# Patient Record
Sex: Female | Born: 1980 | Race: Black or African American | Hispanic: No | Marital: Single | Smoking: Never smoker
Health system: Southern US, Community
[De-identification: ages and names within clinical notes are randomized; demographics above are authoritative.]

## PROBLEM LIST (undated history)

## (undated) DIAGNOSIS — IMO0002 Reserved for concepts with insufficient information to code with codable children: Secondary | ICD-10-CM

## (undated) DIAGNOSIS — A64 Unspecified sexually transmitted disease: Secondary | ICD-10-CM

## (undated) DIAGNOSIS — N390 Urinary tract infection, site not specified: Secondary | ICD-10-CM

## (undated) HISTORY — DX: Unspecified sexually transmitted disease: A64

## (undated) HISTORY — PX: HAND SURGERY: SHX662

## (undated) HISTORY — DX: Reserved for concepts with insufficient information to code with codable children: IMO0002

## (undated) HISTORY — DX: Urinary tract infection, site not specified: N39.0

---

## 1999-03-30 ENCOUNTER — Emergency Department (HOSPITAL_COMMUNITY): Admission: EM | Admit: 1999-03-30 | Discharge: 1999-03-30 | Payer: Self-pay | Admitting: Emergency Medicine

## 2000-06-15 DIAGNOSIS — R87619 Unspecified abnormal cytological findings in specimens from cervix uteri: Secondary | ICD-10-CM

## 2000-06-15 DIAGNOSIS — IMO0002 Reserved for concepts with insufficient information to code with codable children: Secondary | ICD-10-CM

## 2000-06-15 HISTORY — DX: Unspecified abnormal cytological findings in specimens from cervix uteri: R87.619

## 2000-06-15 HISTORY — DX: Reserved for concepts with insufficient information to code with codable children: IMO0002

## 2000-06-15 HISTORY — PX: CRYOTHERAPY: SHX1416

## 2001-09-01 ENCOUNTER — Other Ambulatory Visit: Admission: RE | Admit: 2001-09-01 | Discharge: 2001-09-01 | Payer: Self-pay | Admitting: Gynecology

## 2002-02-21 ENCOUNTER — Other Ambulatory Visit: Admission: RE | Admit: 2002-02-21 | Discharge: 2002-02-21 | Payer: Self-pay | Admitting: Gynecology

## 2003-03-05 ENCOUNTER — Other Ambulatory Visit: Admission: RE | Admit: 2003-03-05 | Discharge: 2003-03-05 | Payer: Self-pay | Admitting: Gynecology

## 2003-09-11 ENCOUNTER — Other Ambulatory Visit: Admission: RE | Admit: 2003-09-11 | Discharge: 2003-09-11 | Payer: Self-pay | Admitting: Gynecology

## 2004-04-09 ENCOUNTER — Other Ambulatory Visit: Admission: RE | Admit: 2004-04-09 | Discharge: 2004-04-09 | Payer: Self-pay | Admitting: Gynecology

## 2005-04-21 ENCOUNTER — Other Ambulatory Visit: Admission: RE | Admit: 2005-04-21 | Discharge: 2005-04-21 | Payer: Self-pay | Admitting: Gynecology

## 2005-12-08 ENCOUNTER — Emergency Department (HOSPITAL_COMMUNITY): Admission: EM | Admit: 2005-12-08 | Discharge: 2005-12-09 | Payer: Self-pay | Admitting: Emergency Medicine

## 2006-05-05 ENCOUNTER — Other Ambulatory Visit: Admission: RE | Admit: 2006-05-05 | Discharge: 2006-05-05 | Payer: Self-pay | Admitting: Gynecology

## 2006-09-09 ENCOUNTER — Other Ambulatory Visit: Admission: RE | Admit: 2006-09-09 | Discharge: 2006-09-09 | Payer: Self-pay | Admitting: Gynecology

## 2007-04-20 ENCOUNTER — Other Ambulatory Visit: Admission: RE | Admit: 2007-04-20 | Discharge: 2007-04-20 | Payer: Self-pay | Admitting: Gynecology

## 2007-11-24 ENCOUNTER — Emergency Department (HOSPITAL_COMMUNITY): Admission: EM | Admit: 2007-11-24 | Discharge: 2007-11-24 | Payer: Self-pay | Admitting: Emergency Medicine

## 2008-12-28 ENCOUNTER — Encounter: Admission: RE | Admit: 2008-12-28 | Discharge: 2008-12-28 | Payer: Self-pay | Admitting: Obstetrics and Gynecology

## 2010-03-25 ENCOUNTER — Emergency Department (HOSPITAL_COMMUNITY): Admission: EM | Admit: 2010-03-25 | Discharge: 2010-03-25 | Payer: Self-pay | Admitting: Family Medicine

## 2011-08-24 DIAGNOSIS — L6681 Central centrifugal cicatricial alopecia: Secondary | ICD-10-CM | POA: Insufficient documentation

## 2011-08-24 DIAGNOSIS — L669 Cicatricial alopecia, unspecified: Secondary | ICD-10-CM | POA: Insufficient documentation

## 2013-05-16 ENCOUNTER — Ambulatory Visit: Payer: Self-pay | Admitting: Certified Nurse Midwife

## 2013-05-30 ENCOUNTER — Encounter: Payer: Self-pay | Admitting: Certified Nurse Midwife

## 2013-06-01 ENCOUNTER — Ambulatory Visit (INDEPENDENT_AMBULATORY_CARE_PROVIDER_SITE_OTHER): Payer: BC Managed Care – PPO | Admitting: Certified Nurse Midwife

## 2013-06-01 ENCOUNTER — Encounter: Payer: Self-pay | Admitting: Certified Nurse Midwife

## 2013-06-01 VITALS — BP 110/70 | HR 68 | Resp 16 | Ht 70.75 in | Wt 245.0 lb

## 2013-06-01 DIAGNOSIS — Z01419 Encounter for gynecological examination (general) (routine) without abnormal findings: Secondary | ICD-10-CM

## 2013-06-01 DIAGNOSIS — Z309 Encounter for contraceptive management, unspecified: Secondary | ICD-10-CM

## 2013-06-01 DIAGNOSIS — Z0000 Encounter for general adult medical examination without abnormal findings: Secondary | ICD-10-CM

## 2013-06-01 LAB — POCT URINALYSIS DIPSTICK
Blood, UA: NEGATIVE
Ketones, UA: NEGATIVE
Nitrite, UA: NEGATIVE
Protein, UA: NEGATIVE
Urobilinogen, UA: NEGATIVE
pH, UA: 5

## 2013-06-01 MED ORDER — DROSPIRENONE-ETHINYL ESTRADIOL 3-0.03 MG PO TABS: 1.0000 | ORAL_TABLET | Freq: Every day | ORAL | Status: DC

## 2013-06-01 NOTE — Progress Notes (Signed)
Reviewed personally.  M. Suzanne Neelam Tiggs, MD.  

## 2013-06-01 NOTE — Progress Notes (Signed)
32 y.o. G0P0000 Single African American Fe here for annual exam.  Periods normal, no problems. Contraception working well. No partner change, no STD screening needed. Seeing wellness MD for lifestyle change, had labs and exam. Patient on thyroid support, and feeling better. No health issues today.  Patient's last menstrual period was 06/01/2013.          Sexually active: no  The current method of family planning is OCP (estrogen/progesterone).    Exercising: no  exercise Smoker:  no  Health Maintenance: Pap:  05-11-12 neg MMG:  none Colonoscopy:  none BMD:   none TDaP:  2008 Labs: Poct urine neg Self breast exam: done occ   reports that she has never smoked. She does not have any smokeless tobacco history on file. She reports that she drinks about 0.5 ounces of alcohol per week. She reports that she does not use illicit drugs.  Past Medical History  Diagnosis Date  . Abnormal Pap smear 2002  . Chronic UTI     Past Surgical History  Procedure Laterality Date  . Cryotherapy  2002    abnormal pap smear dysplasia    Current Outpatient Prescriptions  Medication Sig Dispense Refill  . drospirenone-ethinyl estradiol (OCELLA) 3-0.03 MG tablet Take 1 tablet by mouth daily.      Marland Kitchen liothyronine (CYTOMEL) 5 MCG tablet daily.      Marland Kitchen UNABLE TO FIND Topical spray for alopecia       No current facility-administered medications for this visit.    Family History  Problem Relation Age of Onset  . Hypertension Mother   . Diabetes Paternal Grandmother   . Rheum arthritis Father     ROS:  Pertinent items are noted in HPI.  Otherwise, a comprehensive ROS was negative.  Exam:   BP 110/70  Pulse 68  Resp 16  Ht 5' 10.75" (1.797 m)  Wt 245 lb (111.131 kg)  BMI 34.41 kg/m2  LMP 06/01/2013 Height: 5' 10.75" (179.7 cm)  Ht Readings from Last 3 Encounters:  06/01/13 5' 10.75" (1.797 m)    General appearance: alert, cooperative and appears stated age Head: Normocephalic, without  obvious abnormality, atraumatic Neck: no adenopathy, supple, symmetrical, trachea midline and thyroid normal to inspection and palpation and non-palpable Lungs: clear to auscultation bilaterally Breasts: normal appearance, no masses or tenderness, No nipple retraction or dimpling, No nipple discharge or bleeding, No axillary or supraclavicular adenopathy Heart: regular rate and rhythm Abdomen: soft, non-tender; no masses,  no organomegaly Extremities: extremities normal, atraumatic, no cyanosis or edema Skin: Skin color, texture, turgor normal. No rashes or lesions Lymph nodes: Cervical, supraclavicular, and axillary nodes normal. No abnormal inguinal nodes palpated Neurologic: Grossly normal   Pelvic: External genitalia:  no lesions              Urethra:  normal appearing urethra with no masses, tenderness or lesions              Bartholin's and Skene's: normal                 Vagina: normal appearing vagina with normal color and discharge, no lesions              Cervix: nomal, non tender              Pap taken: yes Bimanual Exam:  Uterus:  normal size, contour, position, consistency, mobility, non-tender and anteverted              Adnexa: normal adnexa and  no mass, fullness, tenderness               Rectovaginal: Confirms               Anus:  normal sphincter tone, no lesions  A:  Well Woman with normal exam  Contraception desires OCP  History of abnormal pap smear with Cryo in 2002 with normal pap smears  P:   Reviewed health and wellness pertinent to exam  Rx Ocella see order  Pap smear as per guidelines yearly   pap smear taken today with HPVHR  counseled on breast self exam, STD prevention, HIV risk factors and prevention, use and side effects of OCP's, adequate intake of calcium and vitamin D, diet and exercise  return annually or prn  An After Visit Summary was printed and given to the patient.

## 2013-06-01 NOTE — Patient Instructions (Signed)
General topics  Next pap or exam is  due in 1 year Take a Women's multivitamin Take 1200 mg. of calcium daily - prefer dietary If any concerns in interim to call back  Breast Self-Awareness Practicing breast self-awareness may pick up problems early, prevent significant medical complications, and possibly save your life. By practicing breast self-awareness, you can become familiar with how your breasts look and feel and if your breasts are changing. This allows you to notice changes early. It can also offer you some reassurance that your breast health is good. One way to learn what is normal for your breasts and whether your breasts are changing is to do a breast self-exam. If you find a lump or something that was not present in the past, it is best to contact your caregiver right away. Other findings that should be evaluated by your caregiver include nipple discharge, especially if it is bloody; skin changes or reddening; areas where the skin seems to be pulled in (retracted); or new lumps and bumps. Breast pain is seldom associated with cancer (malignancy), but should also be evaluated by a caregiver. BREAST SELF-EXAM The best time to examine your breasts is 5 7 days after your menstrual period is over.  ExitCare Patient Information 2013 [employer].   Exercise to Stay Healthy Exercise helps you become and stay healthy. EXERCISE IDEAS AND TIPS Choose exercises that:  You enjoy.  Fit into your day. You do not need to exercise really hard to be healthy. You can do exercises at a slow or medium level and stay healthy. You can:  Stretch before and after working out.  Try yoga, Pilates, or tai chi.  Lift weights.  Walk fast, swim, jog, run, climb stairs, bicycle, dance, or rollerskate.  Take aerobic classes. Exercises that burn about 150 calories:  Running 1  miles in 15 minutes.  Playing volleyball for 45 to 60 minutes.  Washing and waxing a car for 45 to 60  minutes.  Playing touch football for 45 minutes.  Walking 1  miles in 35 minutes.  Pushing a stroller 1  miles in 30 minutes.  Playing basketball for 30 minutes.  Raking leaves for 30 minutes.  Bicycling 5 miles in 30 minutes.  Walking 2 miles in 30 minutes.  Dancing for 30 minutes.  Shoveling snow for 15 minutes.  Swimming laps for 20 minutes.  Walking up stairs for 15 minutes.  Bicycling 4 miles in 15 minutes.  Gardening for 30 to 45 minutes.  Jumping rope for 15 minutes.  Washing windows or floors for 45 to 60 minutes. Document Released: 07/04/2010 Document Revised: 08/24/2011 Document Reviewed: 07/04/2010 ExitCare Patient Information 2013 [employer].   Other topics ( that may be useful information):    Sexually Transmitted Disease Sexually transmitted disease (STD) refers to any infection that is passed from person to person during sexual activity. This may happen by way of saliva, semen, blood, vaginal mucus, or urine. Common STDs include:  Gonorrhea.  Chlamydia.  Syphilis.  HIV/AIDS.  Genital herpes.  Hepatitis B and C.  Trichomonas.  Human papillomavirus (HPV).  Pubic lice. CAUSES  An STD may be spread by bacteria, virus, or parasite. A person can get an STD by:  Sexual intercourse with an infected person.  Sharing sex toys with an infected person.  Sharing needles with an infected person.  Having intimate contact with the genitals, mouth, or rectal areas of an infected person. SYMPTOMS  Some people may not have any symptoms, but   they can still pass the infection to others. Different STDs have different symptoms. Symptoms include:  Painful or bloody urination.  Pain in the pelvis, abdomen, vagina, anus, throat, or eyes.  Skin rash, itching, irritation, growths, or sores (lesions). These usually occur in the genital or anal area.  Abnormal vaginal discharge.  Penile discharge in men.  Soft, flesh-colored skin growths in the  genital or anal area.  Fever.  Pain or bleeding during sexual intercourse.  Swollen glands in the groin area.  Yellow skin and eyes (jaundice). This is seen with hepatitis. DIAGNOSIS  To make a diagnosis, your caregiver may:  Take a medical history.  Perform a physical exam.  Take a specimen (culture) to be examined.  Examine a sample of discharge under a microscope.  Perform blood test TREATMENT   Chlamydia, gonorrhea, trichomonas, and syphilis can be cured with antibiotic medicine.  Genital herpes, hepatitis, and HIV can be treated, but not cured, with prescribed medicines. The medicines will lessen the symptoms.  Genital warts from HPV can be treated with medicine or by freezing, burning (electrocautery), or surgery. Warts may come back.  HPV is a virus and cannot be cured with medicine or surgery.However, abnormal areas may be followed very closely by your caregiver and may be removed from the cervix, vagina, or vulva through office procedures or surgery. If your diagnosis is confirmed, your recent sexual partners need treatment. This is true even if they are symptom-free or have a negative culture or evaluation. They should not have sex until their caregiver says it is okay. HOME CARE INSTRUCTIONS  All sexual partners should be informed, tested, and treated for all STDs.  Take your antibiotics as directed. Finish them even if you start to feel better.  Only take over-the-counter or prescription medicines for pain, discomfort, or fever as directed by your caregiver.  Rest.  Eat a balanced diet and drink enough fluids to keep your urine clear or pale yellow.  Do not have sex until treatment is completed and you have followed up with your caregiver. STDs should be checked after treatment.  Keep all follow-up appointments, Pap tests, and blood tests as directed by your caregiver.  Only use latex condoms and water-soluble lubricants during sexual activity. Do not use  petroleum jelly or oils.  Avoid alcohol and illegal drugs.  Get vaccinated for HPV and hepatitis. If you have not received these vaccines in the past, talk to your caregiver about whether one or both might be right for you.  Avoid risky sex practices that can break the skin. The only way to avoid getting an STD is to avoid all sexual activity.Latex condoms and dental dams (for oral sex) will help lessen the risk of getting an STD, but will not completely eliminate the risk. SEEK MEDICAL CARE IF:   You have a fever.  You have any new or worsening symptoms. Document Released: 08/22/2002 Document Revised: 08/24/2011 Document Reviewed: 08/29/2010 ExitCare Patient Information 2013 [employer].    Domestic Abuse You are being battered or abused if someone close to you hits, pushes, or physically hurts you in any way. You also are being abused if you are forced into activities. You are being sexually abused if you are forced to have sexual contact of any kind. You are being emotionally abused if you are made to feel worthless or if you are constantly threatened. It is important to remember that help is available. No one has the right to abuse you. PREVENTION OF FURTHER   ABUSE  Learn the warning signs of danger. This varies with situations but may include: the use of alcohol, threats, isolation from friends and family, or forced sexual contact. Leave if you feel that violence is going to occur.  If you are attacked or beaten, report it to the police so the abuse is documented. You do not have to press charges. The police can protect you while you or the attackers are leaving. Get the officer's name and badge number and a copy of the report.  Find someone you can trust and tell them what is happening to you: your caregiver, a nurse, clergy member, close friend or family member. Feeling ashamed is natural, but remember that you have done nothing wrong. No one deserves abuse. Document Released:  05/29/2000 Document Revised: 08/24/2011 Document Reviewed: 08/07/2010 ExitCare Patient Information 2013 [employer].    How Much is Too Much Alcohol? Drinking too much alcohol can cause injury, accidents, and health problems. These types of problems can include:   Car crashes.  Falls.  Family fighting (domestic violence).  Drowning.  Fights.  Injuries.  Burns.  Damage to certain organs.  Having a baby with birth defects. ONE DRINK CAN BE TOO MUCH WHEN YOU ARE:  Working.  Pregnant or breastfeeding.  Taking medicines. Ask your doctor.  Driving or planning to drive. If you or someone you know has a drinking problem, get help from a doctor.  Document Released: 03/28/2009 Document Revised: 08/24/2011 Document Reviewed: 03/28/2009 ExitCare Patient Information 2013 [employer].   Smoking Hazards Smoking cigarettes is extremely bad for your health. Tobacco smoke has over 200 known poisons in it. There are over 60 chemicals in tobacco smoke that cause cancer. Some of the chemicals found in cigarette smoke include:   Cyanide.  Benzene.  Formaldehyde.  Methanol (wood alcohol).  Acetylene (fuel used in welding torches).  Ammonia. Cigarette smoke also contains the poisonous gases nitrogen oxide and carbon monoxide.  Cigarette smokers have an increased risk of many serious medical problems and Smoking causes approximately:  90% of all lung cancer deaths in men.  80% of all lung cancer deaths in women.  90% of deaths from chronic obstructive lung disease. Compared with nonsmokers, smoking increases the risk of:  Coronary heart disease by 2 to 4 times.  Stroke by 2 to 4 times.  Men developing lung cancer by 23 times.  Women developing lung cancer by 13 times.  Dying from chronic obstructive lung diseases by 12 times.  . Smoking is the most preventable cause of death and disease in our society.  WHY IS SMOKING ADDICTIVE?  Nicotine is the chemical  agent in tobacco that is capable of causing addiction or dependence.  When you smoke and inhale, nicotine is absorbed rapidly into the bloodstream through your lungs. Nicotine absorbed through the lungs is capable of creating a powerful addiction. Both inhaled and non-inhaled nicotine may be addictive.  Addiction studies of cigarettes and spit tobacco show that addiction to nicotine occurs mainly during the teen years, when young people begin using tobacco products. WHAT ARE THE BENEFITS OF QUITTING?  There are many health benefits to quitting smoking.   Likelihood of developing cancer and heart disease decreases. Health improvements are seen almost immediately.  Blood pressure, pulse rate, and breathing patterns start returning to normal soon after quitting. QUITTING SMOKING   American Lung Association - 1-800-LUNGUSA  American Cancer Society - 1-800-ACS-2345 Document Released: 07/09/2004 Document Revised: 08/24/2011 Document Reviewed: 03/13/2009 ExitCare Patient Information 2013 Exit[employer]   LL[employer].   Stress Management Stress is a state of physical or mental tension that often results from changes in your life or normal routine. Some common causes of stress are:  Death of a loved one.  Injuries or severe illnesses.  Getting fired or changing jobs.  Moving into a new home. Other causes may be:  Sexual problems.  Business or financial losses.  Taking on a large debt.  Regular conflict with someone at home or at work.  Constant tiredness from lack of sleep. It is not just bad things that are stressful. It may be stressful to:  Win the lottery.  Get married.  Buy a new car. The amount of stress that can be easily tolerated varies from person to person. Changes generally cause stress, regardless of the types of change. Too much stress can affect your health. It may lead to physical or emotional problems. Too little stress (boredom) may also become stressful. SUGGESTIONS TO  REDUCE STRESS:  Talk things over with your family and friends. It often is helpful to share your concerns and worries. If you feel your problem is serious, you may want to get help from a professional counselor.  Consider your problems one at a time instead of lumping them all together. Trying to take care of everything at once may seem impossible. List all the things you need to do and then start with the most important one. Set a goal to accomplish 2 or 3 things each day. If you expect to do too many in a single day you will naturally fail, causing you to feel even more stressed.  Do not use alcohol or drugs to relieve stress. Although you may feel better for a short time, they do not remove the problems that caused the stress. They can also be habit forming.  Exercise regularly - at least 3 times per week. Physical exercise can help to relieve that "uptight" feeling and will relax you.  The shortest distance between despair and hope is often a good night's sleep.  Go to bed and get up on time allowing yourself time for appointments without being rushed.  Take a short "time-out" period from any stressful situation that occurs during the day. Close your eyes and take some deep breaths. Starting with the muscles in your face, tense them, hold it for a few seconds, then relax. Repeat this with the muscles in your neck, shoulders, hand, stomach, back and legs.  Take good care of yourself. Eat a balanced diet and get plenty of rest.  Schedule time for having fun. Take a break from your daily routine to relax. HOME CARE INSTRUCTIONS   Call if you feel overwhelmed by your problems and feel you can no longer manage them on your own.  Return immediately if you feel like hurting yourself or someone else. Document Released: 11/25/2000 Document Revised: 08/24/2011 Document Reviewed: 07/18/2007 ExitCare Patient Information 2013 [employer].   

## 2013-11-30 ENCOUNTER — Encounter (HOSPITAL_COMMUNITY): Payer: Self-pay | Admitting: Emergency Medicine

## 2013-11-30 ENCOUNTER — Emergency Department (HOSPITAL_COMMUNITY)
Admission: EM | Admit: 2013-11-30 | Discharge: 2013-12-01 | Disposition: A | Discharge status: Home / Self Care | Payer: BC Managed Care – PPO | Attending: Emergency Medicine | Admitting: Emergency Medicine

## 2013-11-30 ENCOUNTER — Emergency Department (HOSPITAL_COMMUNITY): Payer: BC Managed Care – PPO

## 2013-11-30 DIAGNOSIS — Z79899 Other long term (current) drug therapy: Secondary | ICD-10-CM | POA: Insufficient documentation

## 2013-11-30 DIAGNOSIS — S060X0A Concussion without loss of consciousness, initial encounter: Secondary | ICD-10-CM

## 2013-11-30 DIAGNOSIS — Z87440 Personal history of urinary (tract) infections: Secondary | ICD-10-CM | POA: Insufficient documentation

## 2013-11-30 DIAGNOSIS — S0083XA Contusion of other part of head, initial encounter: Principal | ICD-10-CM | POA: Insufficient documentation

## 2013-11-30 DIAGNOSIS — S0003XA Contusion of scalp, initial encounter: Secondary | ICD-10-CM

## 2013-11-30 DIAGNOSIS — Y92410 Unspecified street and highway as the place of occurrence of the external cause: Secondary | ICD-10-CM | POA: Insufficient documentation

## 2013-11-30 DIAGNOSIS — Y9389 Activity, other specified: Secondary | ICD-10-CM | POA: Insufficient documentation

## 2013-11-30 DIAGNOSIS — S1093XA Contusion of unspecified part of neck, initial encounter: Secondary | ICD-10-CM | POA: Insufficient documentation
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
## 2013-12-01 NOTE — Discharge Instructions (Signed)
Your CAT scan did not show any concerning injuries or bleeding around the brain. Your providers feel you may have a concussion from head injury. Please followup with a primary care provider.    Concussion A concussion is a brain injury. It is caused by:  A hit to the head.  A quick and sudden movement (jolt) of the head or neck. A concussion is usually not life-threatening. Even so, it can cause serious problems. If you had a concussion before, you may have concussion-like problems after a hit to your head. HOME CARE General Instructions  Follow your doctor's directions carefully.  Take medicines only as told by your doctor.  Only take medicines your doctor says are safe.  Do not drink alcohol until your doctor says it is okay. Alcohol and some drugs can slow down healing. They can also put you at risk for further injury.  If you are having trouble remembering things, write them down.  Try to do one thing at a time if you get distracted easily. For example, do not watch TV while making dinner.  Talk to your family members or close friends when making important decisions.  Follow up with your doctor as told.  Watch your symptoms. Tell others to do the same. Serious problems can sometimes happen after a concussion. Older adults are more likely to have these problems.  Tell your teachers, school nurse, school counselor, coach, Event organiserathletic trainer, or work Production designer, theatre/television/filmmanager about your concussion. Tell them about what you can or cannot do. They should watch to see if:  It gets even harder for you to pay attention or concentrate.  It gets even harder for you to remember things or learn new things.  You need more time than normal to finish things.  You become annoyed (irritable) more than before.  You are not able to deal with stress as well.  You have more problems than before.  Rest. Make sure you:  Get plenty of sleep at night.  Go to sleep early.  Go to bed at the same time every  day. Try to wake up at the same time.  Rest during the day.  Take naps when you feel tired.  Limit activities where you have to think a lot or concentrate. These include:  Doing homework.  Doing work related to a job.  Watching TV.  Using the computer. Returning To Your Regular Activities Return to your normal activities slowly, not all at once. You must give your body and brain enough time to heal.   Do not play sports or do other athletic activities until your doctor says it is okay.  Ask your doctor when you can drive, ride a bicycle, or work other vehicles or machines. Never do these things if you feel dizzy.  Ask your doctor about when you can return to work or school. Preventing Another Concussion It is very important to avoid another brain injury, especially before you have healed. In rare cases, another injury can lead to permanent brain damage, brain swelling, or death. The risk of this is greatest during the first 7-10 days after your injury. Avoid injuries by:   Wearing a seat belt when riding in a car.  Not drinking too much alcohol.  Avoiding activities that could lead to a second concussion (such as contact sports).  Wearing a helmet when doing activities like:  Biking.  Skiing.  Skateboarding.  Skating.  Making your home safer by:  Removing things from the floor or stairways that  could make you trip.  Using grab bars in bathrooms and handrails by stairs.  Placing non-slip mats on floors and in bathtubs.  Improve lighting in dark areas. GET HELP IF:  It gets even harder for you to pay attention or concentrate.  It gets even harder for you to remember things or learn new things.  You need more time than normal to finish things.  You become annoyed (irritable) more than before.  You are not able to deal with stress as well.  You have more problems than before.  You have problems keeping your balance.  You are not able to react quickly  when you should. Get help if you have any of these problems for more than 2 weeks:   Lasting (chronic) headaches.  Dizziness or trouble balancing.  Feeling sick to your stomach (nausea).  Seeing (vision) problems.  Being affected by noises or light more than normal.  Feeling sad, low, down in the dumps, blue, gloomy, or empty (depressed).  Mood changes (mood swings).  Feeling of fear or nervousness about what may happen (anxiety).  Feeling annoyed.  Memory problems.  Problems concentrating or paying attention.  Sleep problems.  Feeling tired all the time. GET HELP RIGHT AWAY IF:   You have bad headaches or your headaches get worse.  You have weakness (even if it is in one hand, leg, or part of the face).  You have loss of feeling (numbness).  You feel off balance.  You keep throwing up (vomiting).  You feel tired.  One black center of your eye (pupil) is larger than the other.  You twitch or shake violently (convulse).  Your speech is not clear (slurred).  You are more confused, easily angered (agitated), or annoyed than before.  You have more trouble resting than before.  You are unable to recognize people or places.  You have neck pain.  It is difficult to wake you up.  You have unusual behavior changes.  You pass out (lose consciousness). MAKE SURE YOU:   Understand these instructions.  Will watch your condition.  Will get help right away if you are not doing well or get worse. Document Released: 05/20/2009 Document Revised: 06/06/2013 Document Reviewed: 12/22/2012 [facility] Patient Information 2015 [place], [place]. This information is not intended to replace advice given to you by your health care provider. Make sure you discuss any questions you have with your health care provider.   Hematoma A hematoma is a collection of blood under the skin, in an organ, in a body space, in a joint space, or in other tissue. The blood can clot to form a  lump that you can see and feel. The lump is often firm and may sometimes become sore and tender. Most hematomas get better in a few days to weeks. However, some hematomas may be serious and require medical care. Hematomas can range in size from very small to very large. CAUSES  A hematoma can be caused by a blunt or penetrating injury. It can also be caused by spontaneous leakage from a blood vessel under the skin. Spontaneous leakage from a blood vessel is more likely to occur in older people, especially those taking blood thinners. Sometimes, a hematoma can develop after certain medical procedures. SIGNS AND SYMPTOMS   A firm lump on the body.  Possible pain and tenderness in the area.  Bruising.Blue, dark blue, purple-red, or yellowish skin may appear at the site of the hematoma if the hematoma is close to the  surface of the skin. For hematomas in deeper tissues or body spaces, the signs and symptoms may be subtle. For example, an intra-abdominal hematoma may cause abdominal pain, weakness, fainting, and shortness of breath. An intracranial hematoma may cause a headache or symptoms such as weakness, trouble speaking, or a change in consciousness. DIAGNOSIS  A hematoma can usually be diagnosed based on your medical history and a physical exam. Imaging tests may be needed if your health care provider suspects a hematoma in deeper tissues or body spaces, such as the abdomen, head, or chest. These tests may include ultrasonography or a CT scan.  TREATMENT  Hematomas usually go away on their own over time. Rarely does the blood need to be drained out of the body. Large hematomas or those that may affect vital organs will sometimes need surgical drainage or monitoring. HOME CARE INSTRUCTIONS   Apply ice to the injured area:   Put ice in a plastic bag.   Place a towel between your skin and the bag.   Leave the ice on for 20 minutes, 2-3 times a day for the first 1 to 2 days.   After the  first 2 days, switch to using warm compresses on the hematoma.   Elevate the injured area to help decrease pain and swelling. Wrapping the area with an elastic bandage may also be helpful. Compression helps to reduce swelling and promotes shrinking of the hematoma. Make sure the bandage is not wrapped too tight.   If your hematoma is on a lower extremity and is painful, crutches may be helpful for a couple days.   Only take over-the-counter or prescription medicines as directed by your health care provider. SEEK IMMEDIATE MEDICAL CARE IF:   You have increasing pain, or your pain is not controlled with medicine.   You have a fever.   You have worsening swelling or discoloration.   Your skin over the hematoma breaks or starts bleeding.   Your hematoma is in your chest or abdomen and you have weakness, shortness of breath, or a change in consciousness.  Your hematoma is on your scalp (caused by a fall or injury) and you have a worsening headache or a change in alertness or consciousness. MAKE SURE YOU:   Understand these instructions.  Will watch your condition.  Will get help right away if you are not doing well or get worse. Document Released: 01/14/2004 Document Revised: 02/01/2013 Document Reviewed: 11/09/2012 [facility] Patient Information 2015 [place]. This information is not intended to replace advice given to you by your health care provider. Make sure you discuss any questions you have with your health care provider.

## 2013-12-01 NOTE — ED Provider Notes (Signed)
Medical screening examination/treatment/procedure(s) were performed by non-physician practitioner and as supervising physician I was immediately available for consultation/collaboration.   EKG Interpretation None       Olivia Mackielga M Otter, MD 12/01/13 [phone]

## 2014-06-19 ENCOUNTER — Telehealth: Payer: Self-pay | Admitting: Certified Nurse Midwife

## 2014-06-19 NOTE — Telephone Encounter (Signed)
Left message regarding upcoming appointment has been canceled and needs to be rescheduled. °

## 2014-06-21 ENCOUNTER — Ambulatory Visit: Payer: BC Managed Care – PPO | Admitting: Certified Nurse Midwife

## 2014-06-22 ENCOUNTER — Ambulatory Visit (INDEPENDENT_AMBULATORY_CARE_PROVIDER_SITE_OTHER): Payer: BC Managed Care – PPO | Admitting: Nurse Practitioner

## 2014-06-22 ENCOUNTER — Encounter: Payer: Self-pay | Admitting: Nurse Practitioner

## 2014-06-22 VITALS — BP 116/74 | HR 96 | Ht 71.0 in | Wt 249.0 lb

## 2014-06-22 DIAGNOSIS — E038 Other specified hypothyroidism: Secondary | ICD-10-CM

## 2014-06-22 DIAGNOSIS — Z01419 Encounter for gynecological examination (general) (routine) without abnormal findings: Secondary | ICD-10-CM

## 2014-06-22 DIAGNOSIS — Z0000 Encounter for general adult medical examination without abnormal findings: Secondary | ICD-10-CM

## 2014-06-22 DIAGNOSIS — Z113 Encounter for screening for infections with a predominantly sexual mode of transmission: Secondary | ICD-10-CM

## 2014-06-22 LAB — POCT URINALYSIS DIPSTICK
Bilirubin, UA: NEGATIVE
GLUCOSE UA: NEGATIVE
KETONES UA: NEGATIVE
Leukocytes, UA: NEGATIVE
Nitrite, UA: NEGATIVE
PROTEIN UA: NEGATIVE
RBC UA: NEGATIVE
Urobilinogen, UA: NEGATIVE
pH, UA: 5

## 2014-06-22 LAB — HEMOGLOBIN, FINGERSTICK: Hemoglobin, fingerstick: 11.9 g/dL — ABNORMAL LOW (ref 12.0–16.0)

## 2014-06-22 LAB — TSH: TSH: 0.047 u[IU]/mL — AB (ref 0.350–4.500)

## 2014-06-22 MED ORDER — LIOTHYRONINE SODIUM 25 MCG PO TABS: 25.0000 ug | ORAL_TABLET | Freq: Two times a day (BID) | ORAL | Status: DC

## 2014-06-22 NOTE — Progress Notes (Signed)
Patient ID: Sandy Oconnor, female   DOB: Feb 19, 1981, 34 y.o.   MRN: [id number] 34 y.o. G0 Single African American Fe here for annual exam.  Stopped taking the OCP over the summer - regular but shorter X 2 days.  Went back on OCP in November and cycle 6-7 days.   Moderate to heavy.  Heavy X 4 days.  Cramps that occur intermittent that doubles her over.   not associated with bladder or bowel changes and does not seem to be associated with menses.  Will do a menstrual app and track.  Dr. Alessandra Bevels had been following her for thyroid.  Last TSH about 6 months ago.  She has decided not to return there secondary to expense.  Ended last relationship in November. She is a school principal   Patient's last menstrual period was 06/12/2014 (exact date).          Sexually active: no  The current method of family planning is OCP (estrogen/progesterone).  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 She has never used smokeless tobacco. She reports that she drinks about 1.2 oz of alcohol per week. She reports that she does not use illicit drugs.  Past Medical History  Diagnosis Date  . Abnormal Pap smear 2002  . Chronic UTI     Past Surgical History  Procedure Laterality Date  . Cryotherapy  2002    abnormal pap smear dysplasia    Current Outpatient Prescriptions  Medication Sig Dispense Refill  . drospirenone-ethinyl estradiol (OCELLA) 3-0.03 MG tablet Take 1 tablet by mouth daily. 1 Package 12  . liothyronine (CYTOMEL) 25 MCG tablet Take 1 tablet (25 mcg total) by mouth 2 (two) times daily. 180 tablet 3  . UNABLE TO FIND Topical spray for alopecia     No current facility-administered medications for this visit.    Family History  Problem Relation Age of Onset  .  Hypertension Mother   . Diabetes Paternal Grandmother   . Rheum arthritis Father   . Breast cancer Other 70    ROS:  Pertinent items are noted in HPI.  Otherwise, a comprehensive ROS was negative.  Exam:   BP 116/74 mmHg  Pulse 96  Ht  (1.803 m)  Wt 249 lb (112.946 kg)  BMI 34.74 kg/m2  LMP 06/12/2014 (Exact Date) Height:  (180.3 cm)  Ht Readings from Last 3 Encounters:  06/22/14  (1.803 m)  06/01/13 5' 10.75" (1.797 m)    General appearance: alert, cooperative and appears stated age Head: Normocephalic, without obvious abnormality, atraumatic Neck: no adenopathy, supple, symmetrical, trachea midline and thyroid normal to inspection and palpation Lungs: clear to auscultation bilaterally Breasts: normal appearance, no masses or tenderness Heart: regular rate and rhythm Abdomen: soft, non-tender; no masses,  no organomegaly Extremities: extremities normal, atraumatic, no cyanosis or edema Skin: Skin color, texture, turgor normal. No rashes or lesions Lymph nodes: Cervical, supraclavicular, and axillary nodes normal. No abnormal inguinal nodes palpated Neurologic: Grossly normal   Pelvic: External genitalia:  no lesions              Urethra:  normal appearing urethra with no masses, tenderness or lesions  Bartholin's and Skene's: normal                 Vagina: normal appearing vagina with normal color and discharge, no lesions              Cervix: anteverted              Pap taken: Yes.   Bimanual Exam:  Uterus:  normal size, contour, position, consistency, mobility, non-tender              Adnexa: no mass, fullness, tenderness               Rectovaginal: Confirms               Anus:  normal sphincter tone, no lesions Chaperone present  A:  Well Woman with normal exam  Contraception with OCP History of abnormal pap smear with Cryo in 2002 with normal pap smears since  Hypothyroid  R/O STD's   P:   Reviewed health and wellness  pertinent to exam  Pap smear taken today  Refill on Ocella for a year  Refill on Cytomel for a year  Follow with TSH and other STD's  Counseled on breast self exam, STD prevention, use and side effects of OCP's, adequate intake of calcium and vitamin D, diet and exercise return annually or prn  An After Visit Summary was printed and given to the patient.

## 2014-06-22 NOTE — Patient Instructions (Signed)

## 2014-06-23 LAB — STD PANEL
HIV: NONREACTIVE
Hepatitis B Surface Ag: NEGATIVE

## 2014-06-26 LAB — IPS N GONORRHOEA AND CHLAMYDIA BY PCR

## 2014-06-26 NOTE — Progress Notes (Signed)
Encounter reviewed by Dr. Zayaan Kozak Silva.  

## 2014-06-27 LAB — IPS PAP TEST WITH HPV

## 2014-07-03 ENCOUNTER — Other Ambulatory Visit: Payer: Self-pay | Admitting: Certified Nurse Midwife

## 2014-07-04 ENCOUNTER — Telehealth: Payer: Self-pay | Admitting: *Deleted

## 2014-07-04 NOTE — Telephone Encounter (Signed)
Pt notified in result note.  Closing encounter. 

## 2014-07-04 NOTE — Telephone Encounter (Signed)
-----   Message from Lauro FranklinPatricia Rolen-Grubb, FNP sent at 06/26/2014  8:56 PM EST ----- Let patient know that GC and CHl is negative

## 2014-07-04 NOTE — Telephone Encounter (Signed)
I have attempted to contact this patient by phone with the following results: left message to return call to [place] GillespieStephanie at [phone] on answering machine (mobile [facility]).  Name verified in voicemail, advised call was regarding recent labs.  [phone] (Mobile)

## 2014-07-04 NOTE — Telephone Encounter (Signed)
Medication refill request: Ocella Last AEX: 06/22/14 Next AEX: 06/25/15 Last MMG (if hormonal medication request): none Refill authorized: 06/01/13 #1pack/12R. Today #1pack/12R.   Mrs Patty's patient. Mrs Patty's out of the office today Mrs Eunice BlaseDebbie.

## 2014-07-27 ENCOUNTER — Encounter (HOSPITAL_COMMUNITY): Payer: Self-pay | Admitting: Emergency Medicine

## 2014-07-27 ENCOUNTER — Emergency Department (HOSPITAL_COMMUNITY)
Admission: EM | Admit: 2014-07-27 | Discharge: 2014-07-27 | Disposition: A | Discharge status: Home / Self Care | Payer: BC Managed Care – PPO | Attending: Emergency Medicine | Admitting: Emergency Medicine

## 2014-07-27 DIAGNOSIS — Y9389 Activity, other specified: Secondary | ICD-10-CM | POA: Diagnosis not present

## 2014-07-27 DIAGNOSIS — Z79899 Other long term (current) drug therapy: Secondary | ICD-10-CM | POA: Diagnosis not present

## 2014-07-27 DIAGNOSIS — Y92410 Unspecified street and highway as the place of occurrence of the external cause: Secondary | ICD-10-CM | POA: Insufficient documentation

## 2014-07-27 DIAGNOSIS — S239XXA Sprain of unspecified parts of thorax, initial encounter: Secondary | ICD-10-CM

## 2014-07-27 DIAGNOSIS — Y998 Other external cause status: Secondary | ICD-10-CM | POA: Diagnosis not present

## 2014-07-27 DIAGNOSIS — S233XXA Sprain of ligaments of thoracic spine, initial encounter: Secondary | ICD-10-CM | POA: Diagnosis not present

## 2014-07-27 DIAGNOSIS — S161XXA Strain of muscle, fascia and tendon at neck level, initial encounter: Secondary | ICD-10-CM | POA: Diagnosis not present

## 2014-07-27 DIAGNOSIS — S199XXA Unspecified injury of neck, initial encounter: Secondary | ICD-10-CM | POA: Diagnosis present

## 2014-07-27 DIAGNOSIS — Z87440 Personal history of urinary (tract) infections: Secondary | ICD-10-CM | POA: Diagnosis not present

## 2014-07-27 MED ORDER — NAPROXEN 375 MG PO TABS
375.0000 mg | ORAL_TABLET | Freq: Once | ORAL | Status: AC
  Administered 2014-07-27: 375 mg via ORAL
  Filled 2014-07-27: qty 1

## 2014-07-27 MED ORDER — CYCLOBENZAPRINE HCL 10 MG PO TABS
5.0000 mg | ORAL_TABLET | Freq: Once | ORAL | Status: AC
  Administered 2014-07-27: 5 mg via ORAL
  Filled 2014-07-27: qty 1

## 2014-07-27 MED ORDER — NAPROXEN 375 MG PO TABS: 375.0000 mg | ORAL_TABLET | Freq: Three times a day (TID) | ORAL | Status: DC

## 2014-07-27 MED ORDER — CYCLOBENZAPRINE HCL 5 MG PO TABS: 5.0000 mg | ORAL_TABLET | Freq: Three times a day (TID) | ORAL | Status: DC | PRN
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
## 2014-07-27 NOTE — Discharge Instructions (Signed)
Cervical Sprain °A cervical sprain is an injury in the neck in which the strong, fibrous tissues (ligaments) that connect your neck bones stretch or tear. Cervical sprains can range from mild to severe. Severe cervical sprains can cause the neck vertebrae to be unstable. This can lead to damage of the spinal cord and can result in serious nervous system problems. The amount of time it takes for a cervical sprain to get better depends on the cause and extent of the injury. Most cervical sprains heal in 1 to 3 weeks. °CAUSES  °Severe cervical sprains may be caused by:  °· Contact sport injuries (such as from football, rugby, wrestling, hockey, auto racing, gymnastics, diving, martial arts, or boxing).   °· Motor vehicle collisions.   °· Whiplash injuries. This is an injury from a sudden forward and backward whipping movement of the head and neck.  °· Falls.   °Mild cervical sprains may be caused by:  °· Being in an awkward position, such as while cradling a telephone between your ear and shoulder.   °· Sitting in a chair that does not offer proper support.   °· Working at a poorly designed computer station.   °· Looking up or down for long periods of time.   °SYMPTOMS  °· Pain, soreness, stiffness, or a burning sensation in the front, back, or sides of the neck. This discomfort may develop immediately after the injury or slowly, 24 hours or more after the injury.   °· Pain or tenderness directly in the middle of the back of the neck.   °· Shoulder or upper back pain.   °· Limited ability to move the neck.   °· Headache.   °· Dizziness.   °· Weakness, numbness, or tingling in the hands or arms.   °· Muscle spasms.   °· Difficulty swallowing or chewing.   °· Tenderness and swelling of the neck.   °DIAGNOSIS  °Most of the time your health care provider can diagnose a cervical sprain by taking your history and doing a physical exam. Your health care provider will ask about previous neck injuries and any known neck  problems, such as arthritis in the neck. X-rays may be taken to find out if there are any other problems, such as with the bones of the neck. Other tests, such as a CT scan or MRI, may also be needed.  °TREATMENT  °Treatment depends on the severity of the cervical sprain. Mild sprains can be treated with rest, keeping the neck in place (immobilization), and pain medicines. Severe cervical sprains are immediately immobilized. Further treatment is done to help with pain, muscle spasms, and other symptoms and may include: °· Medicines, such as pain relievers, numbing medicines, or muscle relaxants.   °· Physical therapy. This may involve stretching exercises, strengthening exercises, and posture training. Exercises and improved posture can help stabilize the neck, strengthen muscles, and help stop symptoms from returning.   °HOME CARE INSTRUCTIONS  °· Put ice on the injured area.   °¨ Put ice in a plastic bag.   °¨ Place a towel between your skin and the bag.   °¨ Leave the ice on for 15-20 minutes, 3-4 times a day.   °· If your injury was severe, you may have been given a cervical collar to wear. A cervical collar is a two-piece collar designed to keep your neck from moving while it heals. °¨ Do not remove the collar unless instructed by your health care provider. °¨ If you have long hair, keep it outside of the collar. °¨ Ask your health care provider before making any adjustments to your collar. Minor   adjustments may be required over time to improve comfort and reduce pressure on your chin or on the back of your head.  Ifyou are allowed to remove the collar for cleaning or bathing, follow your health care provider's instructions on how to do so safely.  Keep your collar clean by wiping it with mild soap and water and drying it completely. If the collar you have been given includes removable pads, remove them every 1-2 days and hand wash them with soap and water. Allow them to air dry. They should be completely  dry before you wear them in the collar.  If you are allowed to remove the collar for cleaning and bathing, wash and dry the skin of your neck. Check your skin for irritation or sores. If you see any, tell your health care provider.  Do not drive while wearing the collar.   Only take over-the-counter or prescription medicines for pain, discomfort, or fever as directed by your health care provider.   Keep all follow-up appointments as directed by your health care provider.   Keep all physical therapy appointments as directed by your health care provider.   Make any needed adjustments to your workstation to promote good posture.   Avoid positions and activities that make your symptoms worse.   Warm up and stretch before being active to help prevent problems.  SEEK MEDICAL CARE IF:   Your pain is not controlled with medicine.   You are unable to decrease your pain medicine over time as planned.   Your activity level is not improving as expected.  SEEK IMMEDIATE MEDICAL CARE IF:   You develop any bleeding.  You develop stomach upset.  You have signs of an allergic reaction to your medicine.   Your symptoms get worse.   You develop new, unexplained symptoms.   You have numbness, tingling, weakness, or paralysis in any part of your body.  MAKE SURE YOU:   Understand these instructions.  Will watch your condition.  Will get help right away if you are not doing well or get worse. Document Released: 03/29/2007 Document Revised: 06/06/2013 Document Reviewed: 12/07/2012 [facility] Patient Information 2015 [place], [place]. This information is not intended to replace advice given to you by your health care provider. Make sure you discuss any questions you have with your health care provider. Use ice therapy for the first 24 hours followed by heat therapy
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
## 2014-08-03 ENCOUNTER — Encounter (HOSPITAL_COMMUNITY): Payer: Self-pay | Admitting: Emergency Medicine

## 2014-08-03 ENCOUNTER — Emergency Department (HOSPITAL_COMMUNITY): Payer: BC Managed Care – PPO

## 2014-08-03 ENCOUNTER — Emergency Department (HOSPITAL_COMMUNITY)
Admission: EM | Admit: 2014-08-03 | Discharge: 2014-08-03 | Disposition: A | Discharge status: Home / Self Care | Payer: BC Managed Care – PPO | Attending: Emergency Medicine | Admitting: Emergency Medicine

## 2014-08-03 DIAGNOSIS — T148XXA Other injury of unspecified body region, initial encounter: Secondary | ICD-10-CM

## 2014-08-03 DIAGNOSIS — Y92410 Unspecified street and highway as the place of occurrence of the external cause: Secondary | ICD-10-CM | POA: Insufficient documentation

## 2014-08-03 DIAGNOSIS — S299XXA Unspecified injury of thorax, initial encounter: Secondary | ICD-10-CM | POA: Insufficient documentation

## 2014-08-03 DIAGNOSIS — R112 Nausea with vomiting, unspecified: Secondary | ICD-10-CM | POA: Insufficient documentation

## 2014-08-03 DIAGNOSIS — S3991XA Unspecified injury of abdomen, initial encounter: Secondary | ICD-10-CM | POA: Diagnosis not present

## 2014-08-03 DIAGNOSIS — Z79899 Other long term (current) drug therapy: Secondary | ICD-10-CM | POA: Insufficient documentation

## 2014-08-03 DIAGNOSIS — T1490 Injury, unspecified: Secondary | ICD-10-CM | POA: Insufficient documentation

## 2014-08-03 DIAGNOSIS — Y998 Other external cause status: Secondary | ICD-10-CM | POA: Diagnosis not present

## 2014-08-03 DIAGNOSIS — Y9389 Activity, other specified: Secondary | ICD-10-CM | POA: Diagnosis not present

## 2014-08-03 DIAGNOSIS — S3992XA Unspecified injury of lower back, initial encounter: Secondary | ICD-10-CM | POA: Insufficient documentation

## 2014-08-03 DIAGNOSIS — R0781 Pleurodynia: Secondary | ICD-10-CM

## 2014-08-03 DIAGNOSIS — Z87440 Personal history of urinary (tract) infections: Secondary | ICD-10-CM | POA: Diagnosis not present

## 2014-08-03 LAB — CBC WITH DIFFERENTIAL/PLATELET
BASOS ABS: 0 10*3/uL (ref 0.0–0.1)
Basophils Relative: 0 % (ref 0–1)
Eosinophils Absolute: 0.4 10*3/uL (ref 0.0–0.7)
Eosinophils Relative: 4 % (ref 0–5)
HCT: 39.2 % (ref 36.0–46.0)
Hemoglobin: 12.7 g/dL (ref 12.0–15.0)
LYMPHS ABS: 2.8 10*3/uL (ref 0.7–4.0)
LYMPHS PCT: 29 % (ref 12–46)
MCH: 27.5 pg (ref 26.0–34.0)
MCHC: 32.4 g/dL (ref 30.0–36.0)
MCV: 84.8 fL (ref 78.0–100.0)
Monocytes Absolute: 0.8 10*3/uL (ref 0.1–1.0)
Monocytes Relative: 8 % (ref 3–12)
NEUTROS PCT: 59 % (ref 43–77)
Neutro Abs: 5.8 10*3/uL (ref 1.7–7.7)
PLATELETS: 300 10*3/uL (ref 150–400)
RBC: 4.62 MIL/uL (ref 3.87–5.11)
RDW: 14.4 % (ref 11.5–15.5)
WBC: 9.8 10*3/uL (ref 4.0–10.5)

## 2014-08-03 LAB — COMPREHENSIVE METABOLIC PANEL
ALBUMIN: 4.2 g/dL (ref 3.5–5.2)
ALT: 12 U/L (ref 0–35)
AST: 14 U/L (ref 0–37)
Alkaline Phosphatase: 54 U/L (ref 39–117)
Anion gap: 9 (ref 5–15)
BUN: 14 mg/dL (ref 6–23)
CALCIUM: 9.3 mg/dL (ref 8.4–10.5)
CO2: 25 mmol/L (ref 19–32)
Chloride: 105 mmol/L (ref 96–112)
Creatinine, Ser: 0.81 mg/dL (ref 0.50–1.10)
GLUCOSE: 94 mg/dL (ref 70–99)
POTASSIUM: 4.2 mmol/L (ref 3.5–5.1)
SODIUM: 139 mmol/L (ref 135–145)
TOTAL PROTEIN: 7.8 g/dL (ref 6.0–8.3)
Total Bilirubin: 0.4 mg/dL (ref 0.3–1.2)

## 2014-08-03 LAB — URINALYSIS, ROUTINE W REFLEX MICROSCOPIC
Bilirubin Urine: NEGATIVE
GLUCOSE, UA: NEGATIVE mg/dL
HGB URINE DIPSTICK: NEGATIVE
Ketones, ur: NEGATIVE mg/dL
LEUKOCYTES UA: NEGATIVE
Nitrite: NEGATIVE
Protein, ur: NEGATIVE mg/dL
SPECIFIC GRAVITY, URINE: 1.024 (ref 1.005–1.030)
UROBILINOGEN UA: 0.2 mg/dL (ref 0.0–1.0)
pH: 5.5 (ref 5.0–8.0)

## 2014-08-03 NOTE — Discharge Instructions (Signed)
Muscle Strain °A muscle strain is an injury that occurs when a muscle is stretched beyond its normal length. Usually a small number of muscle fibers are torn when this happens. Muscle strain is rated in degrees. First-degree strains have the least amount of muscle fiber tearing and pain. Second-degree and third-degree strains have increasingly more tearing and pain.  °Usually, recovery from muscle strain takes 1-2 weeks. Complete healing takes 5-6 weeks.  °CAUSES  °Muscle strain happens when a sudden, violent force placed on a muscle stretches it too far. This may occur with lifting, sports, or a fall.  °RISK FACTORS °Muscle strain is especially common in athletes.  °SIGNS AND SYMPTOMS °At the site of the muscle strain, there may be: °· Pain. °· Bruising. °· Swelling. °· Difficulty using the muscle due to pain or lack of normal function. °DIAGNOSIS  °Your health care provider will perform a physical exam and ask about your medical history. °TREATMENT  °Often, the best treatment for a muscle strain is resting, icing, and applying cold compresses to the injured area.   °HOME CARE INSTRUCTIONS  °· Use the PRICE method of treatment to promote muscle healing during the first 2-3 days after your injury. The PRICE method involves: °· Protecting the muscle from being injured again. °· Restricting your activity and resting the injured body part. °· Icing your injury. To do this, put ice in a plastic bag. Place a towel between your skin and the bag. Then, apply the ice and leave it on from 15-20 minutes each hour. After the third day, switch to moist heat packs. °· Apply compression to the injured area with a splint or elastic bandage. Be careful not to wrap it too tightly. This may interfere with blood circulation or increase swelling. °· Elevate the injured body part above the level of your heart as often as you can. °· Only take over-the-counter or prescription medicines for pain, discomfort, or fever as directed by your  health care provider. °· Warming up prior to exercise helps to prevent future muscle strains. °SEEK MEDICAL CARE IF:  °· You have increasing pain or swelling in the injured area. °· You have numbness, tingling, or a significant loss of strength in the injured area. °MAKE SURE YOU:  °· Understand these instructions. °· Will watch your condition. °· Will get help right away if you are not doing well or get worse. °Document Released: 06/01/2005 Document Revised: 03/22/2013 Document Reviewed: 12/29/2012 °[employer]® Patient Information ©2015 [employer]. This information is not intended to replace advice given to you by your health care provider. Make sure you discuss any questions you have with your health care provider. ° °Motor Vehicle Collision °It is common to have multiple bruises and sore muscles after a motor vehicle collision (MVC). These tend to feel worse for the first 24 hours. You may have the most stiffness and soreness over the first several hours. You may also feel worse when you wake up the first morning after your collision. After this point, you will usually begin to improve with each day. The speed of improvement often depends on the severity of the collision, the number of injuries, and the location and nature of these injuries. °HOME CARE INSTRUCTIONS °· Put ice on the injured area. °¨ Put ice in a plastic bag. °¨ Place a towel between your skin and the bag. °¨ Leave the ice on for 15-20 minutes, 3-4 times a day, or as directed by your health care provider. °· Drink enough fluids to   keep your urine clear or pale yellow. Do not drink alcohol. °· Take a warm shower or bath once or twice a day. This will increase blood flow to sore muscles. °· You may return to activities as directed by your caregiver. Be careful when lifting, as this may aggravate neck or back pain. °· Only take over-the-counter or prescription medicines for pain, discomfort, or fever as directed by your caregiver. Do not use  aspirin. This may increase bruising and bleeding. °SEEK IMMEDIATE MEDICAL CARE IF: °· You have numbness, tingling, or weakness in the arms or legs. °· You develop severe headaches not relieved with medicine. °· You have severe neck pain, especially tenderness in the middle of the back of your neck. °· You have changes in bowel or bladder control. °· There is increasing pain in any area of the body. °· You have shortness of breath, light-headedness, dizziness, or fainting. °· You have chest pain. °· You feel sick to your stomach (nauseous), throw up (vomit), or sweat. °· You have increasing abdominal discomfort. °· There is blood in your urine, stool, or vomit. °· You have pain in your shoulder (shoulder strap areas). °· You feel your symptoms are getting worse. °MAKE SURE YOU: °· Understand these instructions. °· Will watch your condition. °· Will get help right away if you are not doing well or get worse. °Document Released: 06/01/2005 Document Revised: 10/16/2013 Document Reviewed: 10/29/2010 °ExitCare® Patient Information ©2015 [employer]. This information is not intended to replace advice given to you by your health care provider. Make sure you discuss any questions you have with your health care provider. ° °
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
## 2014-08-03 NOTE — ED Notes (Signed)
Patient transported to X-ray 

## 2014-11-14 ENCOUNTER — Telehealth: Payer: Self-pay | Admitting: Emergency Medicine

## 2014-11-14 NOTE — Telephone Encounter (Signed)
Many thanks for the follow up and we will see her as scheduled.

## 2014-11-14 NOTE — Telephone Encounter (Signed)
Calling patient per request from Lauro FranklinPatricia Rolen-Grubb, FNP. Received a letter from [facility] that patient has been seen on 09/26/14 at outside provider with dx of Angina Pectoris.   [facility]/o of abdomen and chest pain. She was subsequently diagnosed with Pleurisy and discharged from Emergency Department. Patient states she had an ultrasound of chest and abdomen which were normal. Patient states she was having pain with deep inspiration.  Patient states she continues to have intermittent LLQ pain that radiates to upper L Breast area. She states that she has "episodes" and they last 10-15 minutes and they subside. She states that nothing brings on the pain that she is aware of and that she has not tried anything to make the pain go away. She states she has noticed some aversion to foods that she has not had prior. Denies any other symptoms, no dysuria, diarrhea, constipation, abnormal vaginal bleeding.  At this time, patient denies complaints. She does not have a PCP. She is offered an appointment for evaluation, however, she declines any appointment until after 11/23/14 when school is out.   Patient is scheduled for office visit with NP Grubb for 11/27/14 at 1115 and is advised to return call or seek care at nearest Emergency department if develops sob, chest pain, or any worsening of her symptoms. Patient verbalized understanding of this and agreeable to instructions.   Routing to provider to review and will close encounter.   Call to [facility] at [phone] to request medical records from visit and requested faxed to our office.

## 2014-11-27 ENCOUNTER — Ambulatory Visit (INDEPENDENT_AMBULATORY_CARE_PROVIDER_SITE_OTHER): Payer: BC Managed Care – PPO | Admitting: Nurse Practitioner

## 2014-11-27 ENCOUNTER — Encounter: Payer: Self-pay | Admitting: Nurse Practitioner

## 2014-11-27 VITALS — BP 110/70 | HR 66 | Ht 71.0 in | Wt 252.4 lb

## 2014-11-27 DIAGNOSIS — K5900 Constipation, unspecified: Secondary | ICD-10-CM

## 2014-11-27 DIAGNOSIS — R110 Nausea: Secondary | ICD-10-CM

## 2014-11-27 DIAGNOSIS — R1032 Left lower quadrant pain: Secondary | ICD-10-CM

## 2014-11-27 NOTE — Patient Instructions (Addendum)
  We will call you with information about CT scan And insurance authorization

## 2014-11-27 NOTE — Progress Notes (Signed)
Subjective:     Patient ID: Sandy Oconnor, female   DOB: Dec 08, 1980, 34 y.o.   MRN: [id number]  HPI  This 34 yo SAA female G0 with onset of LLQ pain ~ 2 months.  Pain is intermittent and she describes it as sharp, stabbing lasting for minutes.  The entire episode lasting for up to 3 hours.  These occur 3 separate episodes per week.   Some slight nausea - but not always.  No hep with elimination of bowel or bladder.   Some awakening during hs.   Not associated with a meal or particular kind of diet.  However she has a gluten intolerance.  Usually regular BM daily,  most recently some constipation 1-2 times.  That does not increase episodes of pain.  Last BM yesterday, normal.  No mucous or blood.  No bloating.  No FMH of colon disorder other than MA with IBS.  No history of renal calculi.  Remote history of OV cyst but is on OCP and the pain character is different.  LMP a month ago.  Last SA 04/2014.   Last STD panel 06/22/2014 and was normal.  She had an episode of chest pain and was seen by a cardiologist with normal evaluation and was sent her for further testing. There was a concern that her OCP may have caused the chest pain but that has been ruled out.   Review of Systems  Constitutional: Negative for fever, chills, diaphoresis, appetite change and fatigue.  HENT: Negative.   Respiratory: Negative.   Cardiovascular: Positive for chest pain.       Seen by cardiologist with normal evaluation.  Sent here to evaluate the side pain.  Gastrointestinal: Positive for nausea, abdominal pain and constipation. Negative for vomiting, diarrhea, blood in stool, abdominal distention, anal bleeding and rectal pain.  Genitourinary: Negative for dysuria, urgency, frequency, hematuria, flank pain, decreased urine volume, vaginal bleeding, vaginal discharge, enuresis, genital sores, vaginal pain, menstrual problem, pelvic pain and dyspareunia.  Musculoskeletal: Negative.   Skin: Negative.   Allergic/Immunologic:  Negative.   Neurological: Negative.   Hematological: Negative.   Psychiatric/Behavioral: Negative.        Objective:   Physical Exam  Constitutional: She is oriented to person, place, and time. She appears well-developed and well-nourished. No distress.  Cardiovascular: Normal rate.   Pulmonary/Chest: Effort normal.  Abdominal: Soft. Bowel sounds are normal. She exhibits no distension and no mass. There is tenderness. There is no rebound and no guarding.  With deep palpation there is discomfort that radiates to the left lower quadrant.  Genitourinary:  On bimanual pelvic exam there is no mass and tenderness in the adnexa.  No cervical motion tenderness.  No vaginal discharge.  On bimanual cannot cause the same pain to radiate upward to the left mid quadrant as it does the other way.  Urine is negative chemstrip  Musculoskeletal: Normal range of motion.  There is no rib tenderness or muscular pain or spasm other than slight pain at the SI joint space to hard palpation.  Neurological: She is alert and oriented to person, place, and time.  Psychiatric: She has a normal mood and affect. Her behavior is normal. Judgment and thought content normal.       Assessment:     Right mid to lower quadrant pain ? Etiology No MS origin that causes the pain    Plan:     Will get CT scan of abdomen and pelvis and follow.

## 2014-11-29 ENCOUNTER — Telehealth: Payer: Self-pay | Admitting: Emergency Medicine

## 2014-11-29 DIAGNOSIS — R1032 Left lower quadrant pain: Secondary | ICD-10-CM

## 2014-11-29 NOTE — Telephone Encounter (Signed)
-----   Message from Jerene Bears, MD sent at 11/29/2014  8:05 AM EDT ----- CT scan from [facility].  R/O diverticulitis.  Thanks.  MSM

## 2014-11-29 NOTE — Progress Notes (Signed)
Reviewed personally.  M. Suzanne Jalayna Josten, MD.  

## 2014-12-05 ENCOUNTER — Ambulatory Visit
Admission: RE | Admit: 2014-12-05 | Discharge: 2014-12-05 | Disposition: A | Discharge status: Home / Self Care | Payer: BC Managed Care – PPO | Source: Ambulatory Visit | Attending: Nurse Practitioner | Admitting: Nurse Practitioner

## 2014-12-05 DIAGNOSIS — K5900 Constipation, unspecified: Secondary | ICD-10-CM

## 2014-12-05 DIAGNOSIS — R1032 Left lower quadrant pain: Secondary | ICD-10-CM

## 2014-12-05 DIAGNOSIS — R110 Nausea: Secondary | ICD-10-CM

## 2014-12-05 MED ORDER — IOPAMIDOL (ISOVUE-300) INJECTION 61%
125.0000 mL | Freq: Once | INTRAVENOUS | Status: AC | PRN
  Administered 2014-12-05: 125 mL via INTRAVENOUS

## 2014-12-06 ENCOUNTER — Telehealth: Payer: Self-pay | Admitting: Nurse Practitioner

## 2014-12-06 NOTE — Telephone Encounter (Signed)
Called patient. Advised patient that she is able to have mammogram at age 34 and will need to call [facility] to schedule. Patient declines problems with breast at this time. Advised patient will need to check with insurance regarding coverage for screening mammogram only as some insurance companies only cover screening mammograms at age 37. Gave patient CPT code of bilateral screening mammogram G0202 and dx screening for breast cancer and advised to call insurance company to see if they will cover screening. Patient advised if decides want to complete screening will just need to call to schedule. Patient verbalized understanding. Routing to provider for final review. Patient agreeable to disposition. Will close encounter.

## 2014-12-06 NOTE — Telephone Encounter (Signed)
-----   Message from Ria Comment, FNP sent at 12/05/2014  1:08 PM EDT ----- Please inform patient of a normal CT scan without evidence of infection, diverticula or renal calculi.  The pelvis part of the exam is OK. We see no reason for the left mid to lower quadrant pain.  Please have her to follow with PCP and if needed can send copy of CT scan to them.

## 2014-12-06 NOTE — Telephone Encounter (Signed)
Patient requests referral for mammogram due to family history. States she and Patty spoke about it during last appointment. Patient also notified me she had her CT abdomen/pelvis yesterday.  Patient requests referral sent to the breast center at [facility] imaging.

## 2014-12-06 NOTE — Telephone Encounter (Signed)
Patient notified of results. She is agreeable. Does not have PCP but if pain continues will seek care and request that records be sent. She is advised to follow up prn. Routing to provider for final review. Patient agreeable to disposition. Will close encounter.

## 2014-12-12 ENCOUNTER — Other Ambulatory Visit: Payer: BC Managed Care – PPO

## 2015-06-25 ENCOUNTER — Ambulatory Visit (INDEPENDENT_AMBULATORY_CARE_PROVIDER_SITE_OTHER): Payer: BC Managed Care – PPO | Admitting: Nurse Practitioner

## 2015-06-25 ENCOUNTER — Encounter: Payer: Self-pay | Admitting: Nurse Practitioner

## 2015-06-25 VITALS — BP 122/80 | HR 72 | Ht 71.0 in | Wt 249.0 lb

## 2015-06-25 DIAGNOSIS — Z113 Encounter for screening for infections with a predominantly sexual mode of transmission: Secondary | ICD-10-CM | POA: Diagnosis not present

## 2015-06-25 DIAGNOSIS — Z01419 Encounter for gynecological examination (general) (routine) without abnormal findings: Secondary | ICD-10-CM | POA: Diagnosis not present

## 2015-06-25 DIAGNOSIS — Z0000 Encounter for general adult medical examination without abnormal findings: Secondary | ICD-10-CM | POA: Diagnosis not present

## 2015-06-25 LAB — POCT URINALYSIS DIPSTICK
BILIRUBIN UA: NEGATIVE
Glucose, UA: NEGATIVE
Ketones, UA: NEGATIVE
LEUKOCYTES UA: NEGATIVE
NITRITE UA: NEGATIVE
PH UA: 6
PROTEIN UA: NEGATIVE
RBC UA: NEGATIVE
Urobilinogen, UA: NEGATIVE

## 2015-06-25 MED ORDER — DROSPIRENONE-ETHINYL ESTRADIOL 3-0.03 MG PO TABS: 1.0000 | ORAL_TABLET | Freq: Every day | ORAL | Status: DC

## 2015-06-25 NOTE — Progress Notes (Signed)
Encounter reviewed by Dr. Brook Amundson C. Silva.  

## 2015-06-25 NOTE — Patient Instructions (Signed)
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
## 2015-06-26 LAB — STD PANEL
HIV: NONREACTIVE
Hepatitis B Surface Ag: NEGATIVE

## 2015-06-27 LAB — IPS N GONORRHOEA AND CHLAMYDIA BY PCR

## 2015-07-01 LAB — HEMOGLOBIN, FINGERSTICK: Hemoglobin, fingerstick: 13 g/dL (ref 12.0–16.0)

## 2015-08-30 ENCOUNTER — Ambulatory Visit (INDEPENDENT_AMBULATORY_CARE_PROVIDER_SITE_OTHER): Payer: BC Managed Care – PPO | Admitting: Nurse Practitioner

## 2015-08-30 ENCOUNTER — Encounter: Payer: Self-pay | Admitting: Nurse Practitioner

## 2015-08-30 VITALS — BP 110/86 | HR 64 | Temp 98.4°F | Resp 14 | Ht 71.0 in | Wt 249.0 lb

## 2015-08-30 DIAGNOSIS — N760 Acute vaginitis: Secondary | ICD-10-CM

## 2015-08-30 NOTE — Progress Notes (Signed)
35 y.o. Single African American female G0P0000 here with complaint of vaginal symptoms of itching, burning, and increase discharge. Describes discharge as white and thick.  Some abnormal. Onset of symptoms 3 days ago. Did have a new change in soaps. No STD concerns, not SA since last June. Urinary symptoms none . Contraception is OCP.   O:  Healthy female WDWN Affect: normal, orientation x 3  Exam: no distress Abdomen: soft and non tender Lymph node: no enlargement or tenderness Pelvic exam: External genital: normal female BUS: negative Vagina: white thin discharge noted.  Affirm taken. Adnexa:normal, non tender, no masses or fullness noted    A: Vaginitis   P: Discussed findings of vaginitis and etiology. Discussed Aveeno or baking soda sitz bath for comfort. Avoid moist clothes or pads for extended period of time. If working out in gym clothes or swim suits for long periods of time change underwear or bottoms of swimsuit if possible. Olive Oil/Coconut Oil use for skin protection prior to activity can be used to external skin.  Rx: will hold for results  Follow with [facility]  RV prn

## 2015-08-30 NOTE — Patient Instructions (Signed)
Will call with results

## 2015-08-31 ENCOUNTER — Telehealth: Payer: Self-pay | Admitting: Nurse Practitioner

## 2015-08-31 ENCOUNTER — Other Ambulatory Visit: Payer: Self-pay | Admitting: Nurse Practitioner

## 2015-08-31 LAB — WET PREP BY MOLECULAR PROBE
CANDIDA SPECIES: NEGATIVE
Gardnerella vaginalis: NEGATIVE
TRICHOMONAS VAG: NEGATIVE

## 2015-08-31 MED ORDER — FLUCONAZOLE 150 MG PO TABS: 150.0000 mg | ORAL_TABLET | Freq: Once | ORAL | Status: DC

## 2015-08-31 MED ORDER — METRONIDAZOLE 0.75 % VA GEL: 1.0000 | Freq: Every day | VAGINAL | Status: DC

## 2015-08-31 NOTE — Telephone Encounter (Signed)
See result note for her labs.

## 2015-09-01 NOTE — Progress Notes (Signed)
Encounter reviewed by Dr. Ralynn San Amundson C. Silva.  
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
## 2016-01-31 ENCOUNTER — Encounter: Payer: Self-pay | Admitting: Nurse Practitioner

## 2016-01-31 ENCOUNTER — Ambulatory Visit (INDEPENDENT_AMBULATORY_CARE_PROVIDER_SITE_OTHER): Payer: BC Managed Care – PPO | Admitting: Nurse Practitioner

## 2016-01-31 VITALS — BP 122/76 | HR 72 | Resp 16 | Ht 71.0 in | Wt 252.0 lb

## 2016-01-31 DIAGNOSIS — Z113 Encounter for screening for infections with a predominantly sexual mode of transmission: Secondary | ICD-10-CM | POA: Diagnosis not present

## 2016-01-31 DIAGNOSIS — R21 Rash and other nonspecific skin eruption: Secondary | ICD-10-CM | POA: Diagnosis not present

## 2016-01-31 NOTE — Progress Notes (Signed)
Reviewed personally.  M. Suzanne Carlita Whitcomb, MD.  

## 2016-01-31 NOTE — Progress Notes (Signed)
35 y.o. Single African American female G0P0000 here with complaint of vaginal symptoms of a  sore place on the right.  No history of HSV . Same partner but condom broke about 3 weeks ago.  This vulvar symptom was noted about a week ago.  She did have a waxing done about a week and half ago.    Onset of symptoms 5-6 days ago and felt more tender then.  She has been using Vaseline and area is getting better.  Denies new personal products or vaginal dryness.    No STD concerns but wants to be checked. Urinary symptoms none . Contraception is OCP.   O:  Healthy female WDWN Affect: normal, orientation x 3  Exam: no distress Abdomen: soft and non tender Lymph node: no enlargement or tenderness Pelvic exam: External genital: normal female with an area 1 X 1.5 cm that looks like a friction burn - could have been done during waxing.  No HSV culture was done as there is no specific lesion. BUS: negative Vagina: clear discharge noted.  Affirm taken.    A: R/O Vaginitis  R/O STD's  Vulvar rash or irritation    P: Discussed findings of vaginitis and etiology. Discussed Aveeno or baking soda sitz bath for comfort. Avoid moist clothes or pads for extended period of time.   Rx: none indicated at this time - she may continue using Vaseline prn for comfort.  Follow with [facility] prn

## 2016-02-01 LAB — STD PANEL
HEP B S AG: NEGATIVE
HIV 1&2 Ab, 4th Generation: NONREACTIVE

## 2016-02-01 LAB — WET PREP BY MOLECULAR PROBE
CANDIDA SPECIES: NEGATIVE
GARDNERELLA VAGINALIS: NEGATIVE
Trichomonas vaginosis: NEGATIVE

## 2016-02-03 LAB — HSV(HERPES SMPLX)ABS-I+II(IGG+IGM)-BLD
HERPES SIMPLEX VRS I-IGM AB (EIA): 0.4 {index}
HSV 1 Glycoprotein G Ab, IgG: <0.9 Index (ref <0.90)
HSV 2 GLYCOPROTEIN G AB, IGG: 9.9 {index} — AB (ref <0.90)

## 2016-02-04 ENCOUNTER — Telehealth: Payer: Self-pay | Admitting: Nurse Practitioner

## 2016-02-04 LAB — IPS N GONORRHOEA AND CHLAMYDIA BY PCR

## 2016-02-04 NOTE — Telephone Encounter (Signed)
Patient is given her test results.  See lab note.

## 2016-06-26 ENCOUNTER — Encounter: Payer: Self-pay | Admitting: Nurse Practitioner

## 2016-06-26 ENCOUNTER — Ambulatory Visit: Payer: BC Managed Care – PPO | Admitting: Nurse Practitioner
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
## 2016-07-12 ENCOUNTER — Other Ambulatory Visit: Payer: Self-pay | Admitting: Nurse Practitioner

## 2016-07-13 NOTE — Telephone Encounter (Signed)
Medication refill request: OCP  Last AEX:  06-25-15  Next AEX:07-28-16  Last MMG (if hormonal medication request): N/A Refill authorized: please advise

## 2016-07-28 ENCOUNTER — Ambulatory Visit: Payer: BC Managed Care – PPO | Admitting: Nurse Practitioner
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
## 2016-08-03 ENCOUNTER — Encounter: Payer: Self-pay | Admitting: Nurse Practitioner

## 2016-08-03 ENCOUNTER — Ambulatory Visit (INDEPENDENT_AMBULATORY_CARE_PROVIDER_SITE_OTHER): Payer: BC Managed Care – PPO | Admitting: Nurse Practitioner

## 2016-08-03 ENCOUNTER — Other Ambulatory Visit: Payer: Self-pay | Admitting: Nurse Practitioner

## 2016-08-03 VITALS — BP 118/78 | HR 76 | Resp 16 | Ht 70.25 in | Wt 248.0 lb

## 2016-08-03 DIAGNOSIS — Z0000 Encounter for general adult medical examination without abnormal findings: Secondary | ICD-10-CM | POA: Diagnosis not present

## 2016-08-03 DIAGNOSIS — Z01419 Encounter for gynecological examination (general) (routine) without abnormal findings: Secondary | ICD-10-CM

## 2016-08-03 DIAGNOSIS — Z113 Encounter for screening for infections with a predominantly sexual mode of transmission: Secondary | ICD-10-CM | POA: Diagnosis not present

## 2016-08-03 DIAGNOSIS — N760 Acute vaginitis: Secondary | ICD-10-CM | POA: Diagnosis not present

## 2016-08-03 LAB — HEMOGLOBIN, FINGERSTICK: HEMOGLOBIN, FINGERSTICK: 11.6 g/dL — AB (ref 12.0–15.0)

## 2016-08-03 MED ORDER — DROSPIRENONE-ETHINYL ESTRADIOL 3-0.03 MG PO TABS: 1.0000 | ORAL_TABLET | Freq: Every day | ORAL | 4 refills | Status: DC
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
## 2016-08-03 NOTE — Patient Instructions (Signed)

## 2016-08-04 ENCOUNTER — Telehealth: Payer: Self-pay | Admitting: Nurse Practitioner

## 2016-08-04 LAB — WET PREP BY MOLECULAR PROBE
CANDIDA SPECIES: NEGATIVE
Gardnerella vaginalis: NEGATIVE
Trichomonas vaginosis: NEGATIVE

## 2016-08-04 LAB — HEPATITIS C ANTIBODY: HCV Ab: NEGATIVE

## 2016-08-04 LAB — STD PANEL
HEP B S AG: NEGATIVE
HIV 1&2 Ab, 4th Generation: NONREACTIVE

## 2016-08-04 LAB — GC/CHLAMYDIA PROBE AMP
CT PROBE, AMP APTIMA: NOT DETECTED
GC PROBE AMP APTIMA: NOT DETECTED

## 2016-08-04 NOTE — Telephone Encounter (Signed)
Per DPR OK to leave a voice mail.  First wanted to see what her mother's history for hysterectomy - if AUB or fibroids.  Then told her I discussed her prolonged bleeding with Dr. Edward JollySilva and we thought changing her OCP to a different kind might help - but would only do this for 3 months and see how she does.  Then if still prolonged bleeding would pursue getting PUS to rule out other causes.  She is asked to call back and speak to IlaStephanie.  All of her test are not back yet - so may be able to give her results at same time.

## 2016-08-05 ENCOUNTER — Encounter: Payer: Self-pay | Admitting: Nurse Practitioner

## 2016-08-05 NOTE — Telephone Encounter (Signed)
Thank you for the update!

## 2016-08-06 NOTE — Progress Notes (Signed)
Encounter reviewed by Dr. Janean SarkBrook Amundson C. Silva. Will offer patient a new oral contraceptive x 3 month trial. If lingering menses persist, will pursue pelvic ultrasound.

## 2016-08-07 ENCOUNTER — Encounter: Payer: Self-pay | Admitting: Nurse Practitioner

## 2016-08-07 NOTE — Telephone Encounter (Signed)
Pt sent an e-mail about mother's history of what sounds like fibroid uterus.

## 2016-08-12 ENCOUNTER — Telehealth: Payer: Self-pay | Admitting: Nurse Practitioner

## 2016-08-12 ENCOUNTER — Encounter: Payer: Self-pay | Admitting: Nurse Practitioner

## 2016-08-12 LAB — HSV 1/2 AB (IGM), IFA W/RFLX TITER

## 2016-08-12 LAB — HSV(HERPES SIMPLEX VRS) I + II AB-IGG

## 2016-08-13 NOTE — Telephone Encounter (Signed)
Pt was sent my chart message as she is very hard to get on touch with.

## 2016-08-19 ENCOUNTER — Other Ambulatory Visit (INDEPENDENT_AMBULATORY_CARE_PROVIDER_SITE_OTHER): Payer: BC Managed Care – PPO

## 2016-08-19 DIAGNOSIS — Z113 Encounter for screening for infections with a predominantly sexual mode of transmission: Secondary | ICD-10-CM

## 2016-08-19 DIAGNOSIS — N760 Acute vaginitis: Secondary | ICD-10-CM

## 2016-08-19 NOTE — Addendum Note (Signed)
Addended by: Luisa DagoPHILLIPS, STEPHANIE C on: 08/19/2016 01:16 PM   Modules accepted: Orders

## 2016-08-20 LAB — HSV(HERPES SIMPLEX VRS) I + II AB-IGG: HSV 2 GLYCOPROTEIN G AB, IGG: 8.04 {index} — AB (ref <0.90)

## 2016-08-22 LAB — HSV 1/2 AB (IGM), IFA W/RFLX TITER
HSV 1 IgM Screen: NEGATIVE
HSV 2 IGM SCREEN: NEGATIVE

## 2016-09-07 ENCOUNTER — Telehealth: Payer: Self-pay

## 2016-09-07 NOTE — Telephone Encounter (Signed)
Spoke with patient. Advised Ria CommentPatricia Grubb, FNP received HSV western blot results from 08/25/2016 [facility] and HSV1 result is negative, HSV2 result is positive. Per Ria CommentPatricia Grubb, FNP this is agreeable to serum lab testing the patient had performed with our office on 08/19/2016. Paper copy of results to scan.  Routing to provider for final review. Patient agreeable to disposition. Will close encounter.

## 2017-08-06 ENCOUNTER — Other Ambulatory Visit (HOSPITAL_COMMUNITY)
Admission: RE | Admit: 2017-08-06 | Discharge: 2017-08-06 | Disposition: A | Discharge status: Home / Self Care | Payer: BC Managed Care – PPO | Source: Ambulatory Visit | Attending: Certified Nurse Midwife | Admitting: Certified Nurse Midwife

## 2017-08-06 ENCOUNTER — Other Ambulatory Visit: Payer: Self-pay

## 2017-08-06 ENCOUNTER — Ambulatory Visit: Payer: BC Managed Care – PPO | Admitting: Nurse Practitioner

## 2017-08-06 ENCOUNTER — Encounter: Payer: Self-pay | Admitting: Certified Nurse Midwife

## 2017-08-06 ENCOUNTER — Ambulatory Visit (INDEPENDENT_AMBULATORY_CARE_PROVIDER_SITE_OTHER): Payer: BC Managed Care – PPO | Admitting: Certified Nurse Midwife

## 2017-08-06 VITALS — BP 118/80 | HR 70 | Resp 16 | Ht 70.25 in | Wt 252.0 lb

## 2017-08-06 DIAGNOSIS — Z23 Encounter for immunization: Secondary | ICD-10-CM

## 2017-08-06 DIAGNOSIS — R5383 Other fatigue: Secondary | ICD-10-CM | POA: Diagnosis not present

## 2017-08-06 DIAGNOSIS — Z124 Encounter for screening for malignant neoplasm of cervix: Secondary | ICD-10-CM | POA: Diagnosis present

## 2017-08-06 DIAGNOSIS — Z1151 Encounter for screening for human papillomavirus (HPV): Secondary | ICD-10-CM | POA: Insufficient documentation

## 2017-08-06 DIAGNOSIS — Z3041 Encounter for surveillance of contraceptive pills: Secondary | ICD-10-CM | POA: Diagnosis not present

## 2017-08-06 DIAGNOSIS — Z01419 Encounter for gynecological examination (general) (routine) without abnormal findings: Secondary | ICD-10-CM

## 2017-08-06 DIAGNOSIS — Z113 Encounter for screening for infections with a predominantly sexual mode of transmission: Secondary | ICD-10-CM

## 2017-08-06 MED ORDER — DROSPIRENONE-ETHINYL ESTRADIOL 3-0.03 MG PO TABS: 1.0000 | ORAL_TABLET | Freq: Every day | ORAL | 12 refills | Status: DC
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
## 2017-08-06 NOTE — Patient Instructions (Signed)

## 2017-08-07 LAB — TSH: TSH: 1.13 u[IU]/mL (ref 0.450–4.500)

## 2017-08-07 LAB — HEP, RPR, HIV PANEL
HIV Screen 4th Generation wRfx: NONREACTIVE
Hepatitis B Surface Ag: NEGATIVE
RPR Ser Ql: NONREACTIVE

## 2017-08-07 LAB — HSV(HERPES SIMPLEX VRS) I + II AB-IGG
HSV 1 Glycoprotein G Ab, IgG: <0.91 index (ref 0.00–0.90)
HSV 2 IgG, Type Spec: 8.59 index — ABNORMAL HIGH (ref 0.00–0.90)

## 2017-08-07 LAB — VITAMIN D 25 HYDROXY (VIT D DEFICIENCY, FRACTURES): VIT D 25 HYDROXY: 14.1 ng/mL — AB (ref 30.0–100.0)

## 2017-08-07 LAB — HEPATITIS C ANTIBODY

## 2017-08-08 LAB — VAGINITIS/VAGINOSIS, DNA PROBE
Candida Species: NEGATIVE
Gardnerella vaginalis: NEGATIVE
Trichomonas vaginosis: NEGATIVE

## 2017-08-08 LAB — CHLAMYDIA/GC NAA, CONFIRMATION
CHLAMYDIA TRACHOMATIS, NAA: NEGATIVE
NEISSERIA GONORRHOEAE, NAA: NEGATIVE

## 2017-08-10 ENCOUNTER — Telehealth: Payer: Self-pay

## 2017-08-10 ENCOUNTER — Other Ambulatory Visit: Payer: Self-pay | Admitting: Certified Nurse Midwife

## 2017-08-10 DIAGNOSIS — E559 Vitamin D deficiency, unspecified: Secondary | ICD-10-CM

## 2017-08-10 LAB — CYTOLOGY - PAP
DIAGNOSIS: NEGATIVE
HPV (WINDOPATH): NOT DETECTED

## 2017-08-10 NOTE — Telephone Encounter (Signed)
Left message to call Lenea Bywater at [phone].  Notes recorded by Verner CholLeonard, Deborah S, CNM on 08/10/2017 at 1:08 PM EST Pap smear reviewed negative, HPVHR not detected 02 ------  Notes recorded by Verner CholLeonard, Deborah S, CNM on 08/10/2017 at 8:14 AM EST Notify patient that her Hep B,C, HIV, RPR are negative Vaginal screen is negative for yeast, trichomonas and BV TSH is normal Vitamin D is very low she needs to start on Vitamin D 3 2000 IU daily and repeat check in 2 months order in HSV 1 negative and HSV 2 is still positive she is aware from previous screen

## 2017-08-11 ENCOUNTER — Encounter: Payer: Self-pay | Admitting: Certified Nurse Midwife

## 2017-08-11 ENCOUNTER — Ambulatory Visit: Payer: BC Managed Care – PPO | Admitting: Certified Nurse Midwife

## 2017-08-11 ENCOUNTER — Other Ambulatory Visit: Payer: Self-pay

## 2017-08-11 VITALS — BP 120/80 | HR 70 | Temp 98.2°F | Resp 16 | Ht 70.25 in | Wt 253.0 lb

## 2017-08-11 DIAGNOSIS — T881XXA Other complications following immunization, not elsewhere classified, initial encounter: Secondary | ICD-10-CM

## 2017-08-11 NOTE — Telephone Encounter (Signed)
Pateint is having "some problems after getting tdap 08/06/17. Patient is asking to be today if possible.

## 2017-08-11 NOTE — Patient Instructions (Signed)
Rub arm several times to help with absorption. Take Alieve as discussed every 11-12 hours with food. Increase water intake. Advise if needed if no change

## 2017-08-11 NOTE — Telephone Encounter (Signed)
Patient walked into the office today for concerns with Tdap. Office visit scheduled per Billie RuddySally Yeakley, RN with Leota Sauerseborah Leonard CNM for today.

## 2017-08-11 NOTE — Progress Notes (Signed)
Subjective:     Patient ID: Sandy Oconnor, female   DOB: 25-Sep-1980, 37 y.o.   MRN: [id number]  Patient complaining of fatigue and malaise since 08/07/17 with occasional nausea, chills, but no fever and headache. Some swelling underarm and slight area around neck. Had loose stools once  4 days ago. Wonders if related to TDAP she received on 08/07/17 and came to have area checked. Did not massage arm after immunization     Review of Systems  Constitutional: Negative for activity change.  Gastrointestinal:       Previously , not today       Objective:   Physical Exam  Constitutional: She is oriented to person, place, and time. She appears well-developed and well-nourished.  Neck: Normal range of motion.  Musculoskeletal: She exhibits tenderness.  Slight tenderness of left arm with very faint increase warmth felt in area of injection. Very, very faint diffuse pink 2 cm size around injection site only.  Lymphadenopathy:       Head (right side): No submandibular adenopathy present.       Head (left side): No submandibular adenopathy present.    She has no cervical adenopathy.       Right cervical: No superficial cervical, no deep cervical and no posterior cervical adenopathy present.      Left cervical: No superficial cervical, no deep cervical and no posterior cervical adenopathy present.    She has no axillary adenopathy.       Right axillary: No pectoral and no lateral adenopathy present.       Left axillary: No pectoral and no lateral adenopathy present. Neurological: She is alert and oriented to person, place, and time.  Skin: Skin is warm and dry. No rash noted. No erythema.  Psychiatric: She has a normal mood and affect. Her behavior is normal. Judgment and thought content normal.       Assessment:     TDAP injection 6 days ago with localized warmth and tenderness only, no rash or systemic reaction noted    Plan:     Discussed finding with patient and that is intramuscular  injection and absorbs slowly. Encourage to rub arm to help with soreness and OTC Alieve per instructions for discomfort if needed. Feeling fatigue is normal with immune antibody response of immunization. Patient feels reassured, was worried.  RV prn

## 2017-08-17 NOTE — Telephone Encounter (Signed)
Left message to call Kaitlyn at [phone]. 

## 2017-08-19 NOTE — Telephone Encounter (Addendum)
Sandy Oconnor CNM patient was seen in the office on 08/11/2017. I have attempted to reach her x 2 with no return call regarding her results from 08/10/2017. Please advise. Unsure if these results were given to patient at her appointment on 08/11/2017.

## 2017-08-20 NOTE — Telephone Encounter (Signed)
Spoke with patient. Advised of results as seen below from PepsiCoDeborah Leonard CNM. Patient verbalizes understanding. Declines to schedule 2 month lab recheck as she is driving. Will call back to schedule.Will close encounter.

## 2017-08-20 NOTE — Telephone Encounter (Signed)
Left message to call Severin Bou at [phone]. 

## 2017-08-20 NOTE — Telephone Encounter (Signed)
I do not believe these were reviewed with her that day. Please call

## 2017-11-18 ENCOUNTER — Other Ambulatory Visit (INDEPENDENT_AMBULATORY_CARE_PROVIDER_SITE_OTHER): Payer: BC Managed Care – PPO

## 2017-11-18 DIAGNOSIS — E559 Vitamin D deficiency, unspecified: Secondary | ICD-10-CM

## 2017-11-19 LAB — VITAMIN D 25 HYDROXY (VIT D DEFICIENCY, FRACTURES): Vit D, 25-Hydroxy: 36.9 ng/mL (ref 30.0–100.0)

## 2018-07-30 ENCOUNTER — Other Ambulatory Visit: Payer: Self-pay | Admitting: Certified Nurse Midwife

## 2018-07-30 DIAGNOSIS — Z3041 Encounter for surveillance of contraceptive pills: Secondary | ICD-10-CM

## 2018-08-01 NOTE — Telephone Encounter (Signed)
Medication refill request: OCP Last AEX:  08/06/17 DL Next AEX: 7/86/76 Last MMG (if hormonal medication request): n/a Refill authorized: 08/06/17 #1 w/12 refills; Order pended for #1 pack w/0 refill

## 2018-08-18 ENCOUNTER — Emergency Department (HOSPITAL_COMMUNITY)
Admission: EM | Admit: 2018-08-18 | Discharge: 2018-08-18 | Disposition: A | Discharge status: Home / Self Care | Payer: BC Managed Care – PPO | Attending: Emergency Medicine | Admitting: Emergency Medicine

## 2018-08-18 ENCOUNTER — Encounter (HOSPITAL_COMMUNITY): Payer: Self-pay | Admitting: Emergency Medicine

## 2018-08-18 ENCOUNTER — Other Ambulatory Visit: Payer: Self-pay

## 2018-08-18 DIAGNOSIS — S46812A Strain of other muscles, fascia and tendons at shoulder and upper arm level, left arm, initial encounter: Secondary | ICD-10-CM | POA: Insufficient documentation

## 2018-08-18 DIAGNOSIS — Z79899 Other long term (current) drug therapy: Secondary | ICD-10-CM | POA: Diagnosis not present

## 2018-08-18 DIAGNOSIS — R51 Headache: Secondary | ICD-10-CM | POA: Insufficient documentation

## 2018-08-18 DIAGNOSIS — Y999 Unspecified external cause status: Secondary | ICD-10-CM | POA: Diagnosis not present

## 2018-08-18 DIAGNOSIS — Y9389 Activity, other specified: Secondary | ICD-10-CM | POA: Diagnosis not present

## 2018-08-18 DIAGNOSIS — M25562 Pain in left knee: Secondary | ICD-10-CM | POA: Insufficient documentation

## 2018-08-18 DIAGNOSIS — Y92410 Unspecified street and highway as the place of occurrence of the external cause: Secondary | ICD-10-CM | POA: Diagnosis not present

## 2018-08-18 DIAGNOSIS — M549 Dorsalgia, unspecified: Secondary | ICD-10-CM | POA: Diagnosis not present

## 2018-08-18 DIAGNOSIS — S4992XA Unspecified injury of left shoulder and upper arm, initial encounter: Secondary | ICD-10-CM | POA: Diagnosis present

## 2018-08-18 MED ORDER — NAPROXEN 500 MG PO TABS: 500.0000 mg | ORAL_TABLET | Freq: Two times a day (BID) | ORAL | 0 refills | Status: DC

## 2018-08-18 NOTE — Discharge Instructions (Addendum)
Take naproxen 2 times a day with meals.  Do not take other anti-inflammatories at the same time (Advil, Motrin, ibuprofen , Aleve). You may supplement with Tylenol if you need further pain control. Use muscle cream such as salonpas, icy hot, BenGay, or Biofreeze as needed for muscle pain. Use ice packs or heating pads if this helps control your pain. You will likely have continued muscle stiffness and soreness over the next couple days.  Follow-up with primary care in 1 week if your symptoms are not improving. Follow-up with orthopedics in 1 week if your knee is not improving. Return to the emergency room if you develop vision changes, vomiting, slurred speech, numbness, loss of bowel or bladder control, or any new or worsening symptoms.
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
## 2018-08-29 ENCOUNTER — Other Ambulatory Visit: Payer: Self-pay | Admitting: Certified Nurse Midwife

## 2018-08-29 DIAGNOSIS — Z3041 Encounter for surveillance of contraceptive pills: Secondary | ICD-10-CM

## 2018-08-29 NOTE — Telephone Encounter (Signed)
Medication refill request: Syeda  Last AEX:  08/06/17 DL Next AEX: 02/12/50 DL Last MMG (if hormonal medication request): none Refill authorized: 08/01/18 #28/0R.   Today #28/0R?  Routed to [employer]. DL day off

## 2018-09-01 ENCOUNTER — Encounter: Payer: Self-pay | Admitting: Certified Nurse Midwife

## 2018-09-01 ENCOUNTER — Ambulatory Visit: Payer: BC Managed Care – PPO | Admitting: Certified Nurse Midwife

## 2018-09-01 ENCOUNTER — Other Ambulatory Visit: Payer: Self-pay

## 2018-09-01 ENCOUNTER — Other Ambulatory Visit (HOSPITAL_COMMUNITY)
Admission: RE | Admit: 2018-09-01 | Discharge: 2018-09-01 | Disposition: A | Discharge status: Home / Self Care | Payer: BC Managed Care – PPO | Source: Ambulatory Visit | Attending: Obstetrics & Gynecology | Admitting: Obstetrics & Gynecology

## 2018-09-01 VITALS — BP 110/80 | HR 70 | Temp 98.6°F | Resp 16 | Ht 70.75 in | Wt 261.0 lb

## 2018-09-01 DIAGNOSIS — E559 Vitamin D deficiency, unspecified: Secondary | ICD-10-CM | POA: Diagnosis not present

## 2018-09-01 DIAGNOSIS — Z01419 Encounter for gynecological examination (general) (routine) without abnormal findings: Secondary | ICD-10-CM

## 2018-09-01 DIAGNOSIS — Z124 Encounter for screening for malignant neoplasm of cervix: Secondary | ICD-10-CM | POA: Diagnosis present

## 2018-09-01 DIAGNOSIS — Z3041 Encounter for surveillance of contraceptive pills: Secondary | ICD-10-CM

## 2018-09-01 DIAGNOSIS — Z0000 Encounter for general adult medical examination without abnormal findings: Secondary | ICD-10-CM

## 2018-09-01 DIAGNOSIS — Z113 Encounter for screening for infections with a predominantly sexual mode of transmission: Secondary | ICD-10-CM | POA: Diagnosis not present

## 2018-09-01 DIAGNOSIS — Z8742 Personal history of other diseases of the female genital tract: Secondary | ICD-10-CM

## 2018-09-01 MED ORDER — DROSPIRENONE-ETHINYL ESTRADIOL 3-0.03 MG PO TABS: 1.0000 | ORAL_TABLET | Freq: Every day | ORAL | 4 refills | Status: DC
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
## 2018-09-02 LAB — TSH: TSH: 1.42 u[IU]/mL (ref 0.450–4.500)

## 2018-09-02 LAB — HEPATITIS C ANTIBODY: Hep C Virus Ab: <0.1 s/co ratio (ref 0.0–0.9)

## 2018-09-02 LAB — RPR+HBSAG+HIV
HIV Screen 4th Generation wRfx: NONREACTIVE
Hepatitis B Surface Ag: NEGATIVE
RPR Ser Ql: NONREACTIVE

## 2018-09-02 LAB — VITAMIN D 25 HYDROXY (VIT D DEFICIENCY, FRACTURES): Vit D, 25-Hydroxy: 35.6 ng/mL (ref 30.0–100.0)

## 2018-09-05 LAB — CYTOLOGY - PAP: Diagnosis: NEGATIVE

## 2019-09-01 ENCOUNTER — Encounter: Payer: Self-pay | Admitting: Certified Nurse Midwife

## 2019-09-07 ENCOUNTER — Ambulatory Visit: Payer: BC Managed Care – PPO | Admitting: Certified Nurse Midwife

## 2019-09-11 ENCOUNTER — Ambulatory Visit: Payer: BC Managed Care – PPO | Admitting: Certified Nurse Midwife

## 2019-09-13 ENCOUNTER — Other Ambulatory Visit: Payer: Self-pay

## 2019-09-13 DIAGNOSIS — Z3041 Encounter for surveillance of contraceptive pills: Secondary | ICD-10-CM

## 2019-09-13 MED ORDER — DROSPIRENONE-ETHINYL ESTRADIOL 3-0.03 MG PO TABS: 1.0000 | ORAL_TABLET | Freq: Every day | ORAL | 0 refills | Status: DC

## 2019-09-13 NOTE — Telephone Encounter (Signed)
Medication refill request: Syeda Last AEX:  09/01/18 DL Next AEX: 7/61/95 Last MMG (if hormonal medication request): n/a Refill authorized: Please advise; Order pended #3 packs with 0 refills if authorized
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
## 2019-09-25 ENCOUNTER — Other Ambulatory Visit: Payer: Self-pay

## 2019-09-25 ENCOUNTER — Encounter: Payer: Self-pay | Admitting: Obstetrics and Gynecology

## 2019-09-25 ENCOUNTER — Ambulatory Visit (INDEPENDENT_AMBULATORY_CARE_PROVIDER_SITE_OTHER): Payer: No Typology Code available for payment source | Admitting: Obstetrics and Gynecology

## 2019-09-25 VITALS — BP 118/78 | HR 85 | Temp 97.9°F | Ht 71.0 in | Wt 235.0 lb

## 2019-09-25 DIAGNOSIS — Z3041 Encounter for surveillance of contraceptive pills: Secondary | ICD-10-CM | POA: Diagnosis not present

## 2019-09-25 DIAGNOSIS — Z6832 Body mass index (BMI) 32.0-32.9, adult: Secondary | ICD-10-CM | POA: Diagnosis not present

## 2019-09-25 DIAGNOSIS — Z01419 Encounter for gynecological examination (general) (routine) without abnormal findings: Secondary | ICD-10-CM | POA: Diagnosis not present

## 2019-09-25 DIAGNOSIS — Z0000 Encounter for general adult medical examination without abnormal findings: Secondary | ICD-10-CM

## 2019-09-25 DIAGNOSIS — E559 Vitamin D deficiency, unspecified: Secondary | ICD-10-CM | POA: Diagnosis not present

## 2019-09-25 DIAGNOSIS — Z8639 Personal history of other endocrine, nutritional and metabolic disease: Secondary | ICD-10-CM

## 2019-09-25 MED ORDER — DROSPIRENONE-ETHINYL ESTRADIOL 3-0.03 MG PO TABS: 1.0000 | ORAL_TABLET | Freq: Every day | ORAL | 3 refills | Status: DC

## 2019-09-25 NOTE — Patient Instructions (Signed)

## 2019-09-26 LAB — LIPID PANEL
Chol/HDL Ratio: 2.2 ratio (ref 0.0–4.4)
Cholesterol, Total: 175 mg/dL (ref 100–199)
HDL: 81 mg/dL (ref >39)
LDL Chol Calc (NIH): 85 mg/dL (ref 0–99)
Triglycerides: 44 mg/dL (ref 0–149)
VLDL Cholesterol Cal: 9 mg/dL (ref 5–40)

## 2019-09-26 LAB — COMPREHENSIVE METABOLIC PANEL
ALT: 13 IU/L (ref 0–32)
AST: 12 IU/L (ref 0–40)
Albumin/Globulin Ratio: 1.3 (ref 1.2–2.2)
Albumin: 3.7 g/dL — ABNORMAL LOW (ref 3.8–4.8)
Alkaline Phosphatase: 54 IU/L (ref 39–117)
BUN/Creatinine Ratio: 14 (ref 9–23)
BUN: 9 mg/dL (ref 6–24)
Bilirubin Total: 0.3 mg/dL (ref 0.0–1.2)
CO2: 22 mmol/L (ref 20–29)
Calcium: 8.9 mg/dL (ref 8.7–10.2)
Chloride: 105 mmol/L (ref 96–106)
Creatinine, Ser: 0.65 mg/dL (ref 0.57–1.00)
GFR calc Af Amer: 128 mL/min/{1.73_m2} (ref >59)
GFR calc non Af Amer: 111 mL/min/{1.73_m2} (ref >59)
Globulin, Total: 2.9 g/dL (ref 1.5–4.5)
Glucose: 89 mg/dL (ref 65–99)
Potassium: 3.7 mmol/L (ref 3.5–5.2)
Sodium: 138 mmol/L (ref 134–144)
Total Protein: 6.6 g/dL (ref 6.0–8.5)

## 2019-09-26 LAB — CBC
Hematocrit: 36.4 % (ref 34.0–46.6)
Hemoglobin: 11.6 g/dL (ref 11.1–15.9)
MCH: 27.4 pg (ref 26.6–33.0)
MCHC: 31.9 g/dL (ref 31.5–35.7)
MCV: 86 fL (ref 79–97)
Platelets: 297 10*3/uL (ref 150–450)
RBC: 4.23 x10E6/uL (ref 3.77–5.28)
RDW: 13.7 % (ref 11.7–15.4)
WBC: 6.3 10*3/uL (ref 3.4–10.8)

## 2019-09-26 LAB — HEMOGLOBIN A1C
Est. average glucose Bld gHb Est-mCnc: 108 mg/dL
Hgb A1c MFr Bld: 5.4 % (ref 4.8–5.6)

## 2019-09-26 LAB — TSH: TSH: 0.736 u[IU]/mL (ref 0.450–4.500)

## 2019-09-26 LAB — VITAMIN D 25 HYDROXY (VIT D DEFICIENCY, FRACTURES): Vit D, 25-Hydroxy: 29.3 ng/mL — ABNORMAL LOW (ref 30.0–100.0)

## 2019-09-28 DIAGNOSIS — L219 Seborrheic dermatitis, unspecified: Secondary | ICD-10-CM | POA: Insufficient documentation

## 2020-09-30 ENCOUNTER — Encounter: Payer: Self-pay | Admitting: Obstetrics and Gynecology
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
## 2020-10-02 ENCOUNTER — Other Ambulatory Visit: Payer: Self-pay

## 2020-10-02 ENCOUNTER — Other Ambulatory Visit (HOSPITAL_COMMUNITY)
Admission: RE | Admit: 2020-10-02 | Discharge: 2020-10-02 | Disposition: A | Discharge status: Home / Self Care | Payer: BC Managed Care – PPO | Source: Ambulatory Visit | Attending: Obstetrics and Gynecology | Admitting: Obstetrics and Gynecology

## 2020-10-02 ENCOUNTER — Encounter: Payer: Self-pay | Admitting: Obstetrics and Gynecology

## 2020-10-02 ENCOUNTER — Ambulatory Visit (INDEPENDENT_AMBULATORY_CARE_PROVIDER_SITE_OTHER): Payer: No Typology Code available for payment source | Admitting: Obstetrics and Gynecology

## 2020-10-02 VITALS — BP 130/72 | HR 76 | Ht 70.0 in | Wt 238.0 lb

## 2020-10-02 DIAGNOSIS — Z01419 Encounter for gynecological examination (general) (routine) without abnormal findings: Secondary | ICD-10-CM

## 2020-10-02 DIAGNOSIS — Z3041 Encounter for surveillance of contraceptive pills: Secondary | ICD-10-CM

## 2020-10-02 DIAGNOSIS — E559 Vitamin D deficiency, unspecified: Secondary | ICD-10-CM

## 2020-10-02 DIAGNOSIS — Z23 Encounter for immunization: Secondary | ICD-10-CM

## 2020-10-02 DIAGNOSIS — Z8639 Personal history of other endocrine, nutritional and metabolic disease: Secondary | ICD-10-CM

## 2020-10-02 DIAGNOSIS — Z7185 Encounter for immunization safety counseling: Secondary | ICD-10-CM

## 2020-10-02 DIAGNOSIS — Z6834 Body mass index (BMI) 34.0-34.9, adult: Secondary | ICD-10-CM

## 2020-10-02 DIAGNOSIS — Z124 Encounter for screening for malignant neoplasm of cervix: Secondary | ICD-10-CM

## 2020-10-02 DIAGNOSIS — Z0000 Encounter for general adult medical examination without abnormal findings: Secondary | ICD-10-CM

## 2020-10-02 MED ORDER — DROSPIRENONE-ETHINYL ESTRADIOL 3-0.03 MG PO TABS: 1.0000 | ORAL_TABLET | Freq: Every day | ORAL | 3 refills | Status: DC

## 2020-10-02 NOTE — Patient Instructions (Signed)

## 2020-10-03 LAB — LIPID PANEL
Cholesterol: 190 mg/dL (ref <200)
HDL: 77 mg/dL (ref >=50)
LDL Cholesterol (Calc): 99 mg/dL (calc)
Non-HDL Cholesterol (Calc): 113 mg/dL (calc) (ref <130)
Total CHOL/HDL Ratio: 2.5 (calc) (ref <5.0)
Triglycerides: 62 mg/dL (ref <150)

## 2020-10-03 LAB — COMPREHENSIVE METABOLIC PANEL
AG Ratio: 1.4 (calc) (ref 1.0–2.5)
ALT: 10 U/L (ref 6–29)
AST: 10 U/L (ref 10–30)
Albumin: 3.9 g/dL (ref 3.6–5.1)
Alkaline phosphatase (APISO): 42 U/L (ref 31–125)
BUN: 13 mg/dL (ref 7–25)
CO2: 24 mmol/L (ref 20–32)
Calcium: 9.1 mg/dL (ref 8.6–10.2)
Chloride: 108 mmol/L (ref 98–110)
Creat: 0.83 mg/dL (ref 0.50–1.10)
Globulin: 2.7 g/dL (calc) (ref 1.9–3.7)
Glucose, Bld: 89 mg/dL (ref 65–99)
Potassium: 4.2 mmol/L (ref 3.5–5.3)
Sodium: 140 mmol/L (ref 135–146)
Total Bilirubin: 0.4 mg/dL (ref 0.2–1.2)
Total Protein: 6.6 g/dL (ref 6.1–8.1)

## 2020-10-03 LAB — CYTOLOGY - PAP
Comment: NEGATIVE
Diagnosis: NEGATIVE
High risk HPV: NEGATIVE

## 2020-10-03 LAB — VITAMIN D 25 HYDROXY (VIT D DEFICIENCY, FRACTURES): Vit D, 25-Hydroxy: 20 ng/mL — ABNORMAL LOW (ref 30–100)

## 2020-10-03 LAB — CBC
HCT: 39.1 % (ref 35.0–45.0)
Hemoglobin: 12.2 g/dL (ref 11.7–15.5)
MCH: 26.8 pg — ABNORMAL LOW (ref 27.0–33.0)
MCHC: 31.2 g/dL — ABNORMAL LOW (ref 32.0–36.0)
MCV: 85.9 fL (ref 80.0–100.0)
MPV: 11.8 fL (ref 7.5–12.5)
Platelets: 265 10*3/uL (ref 140–400)
RBC: 4.55 10*6/uL (ref 3.80–5.10)
RDW: 13.4 % (ref 11.0–15.0)
WBC: 6.7 10*3/uL (ref 3.8–10.8)

## 2020-10-03 LAB — TSH: TSH: 1.38 mIU/L

## 2020-10-03 LAB — HEMOGLOBIN A1C
Hgb A1c MFr Bld: 5.3 % of total Hgb (ref <5.7)
Mean Plasma Glucose: 105 mg/dL
eAG (mmol/L): 5.8 mmol/L

## 2020-12-02 ENCOUNTER — Ambulatory Visit (INDEPENDENT_AMBULATORY_CARE_PROVIDER_SITE_OTHER): Payer: No Typology Code available for payment source | Admitting: Anesthesiology

## 2020-12-02 ENCOUNTER — Other Ambulatory Visit: Payer: Self-pay

## 2020-12-02 DIAGNOSIS — Z23 Encounter for immunization: Secondary | ICD-10-CM | POA: Diagnosis not present

## 2021-04-07 ENCOUNTER — Ambulatory Visit (INDEPENDENT_AMBULATORY_CARE_PROVIDER_SITE_OTHER): Payer: No Typology Code available for payment source | Admitting: Gynecology

## 2021-04-07 ENCOUNTER — Other Ambulatory Visit: Payer: Self-pay

## 2021-04-07 DIAGNOSIS — Z23 Encounter for immunization: Secondary | ICD-10-CM
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
## 2021-10-06 ENCOUNTER — Ambulatory Visit (INDEPENDENT_AMBULATORY_CARE_PROVIDER_SITE_OTHER): Payer: No Typology Code available for payment source | Admitting: Obstetrics and Gynecology

## 2021-10-06 ENCOUNTER — Encounter: Payer: Self-pay | Admitting: Obstetrics and Gynecology

## 2021-10-06 VITALS — BP 122/80 | Ht 70.5 in | Wt 242.0 lb

## 2021-10-06 DIAGNOSIS — Z114 Encounter for screening for human immunodeficiency virus [HIV]: Secondary | ICD-10-CM

## 2021-10-06 DIAGNOSIS — Z3041 Encounter for surveillance of contraceptive pills: Secondary | ICD-10-CM | POA: Diagnosis not present

## 2021-10-06 DIAGNOSIS — Z6834 Body mass index (BMI) 34.0-34.9, adult: Secondary | ICD-10-CM | POA: Diagnosis not present

## 2021-10-06 DIAGNOSIS — N926 Irregular menstruation, unspecified: Secondary | ICD-10-CM

## 2021-10-06 DIAGNOSIS — Z01419 Encounter for gynecological examination (general) (routine) without abnormal findings: Secondary | ICD-10-CM | POA: Diagnosis not present

## 2021-10-06 DIAGNOSIS — Z113 Encounter for screening for infections with a predominantly sexual mode of transmission: Secondary | ICD-10-CM

## 2021-10-06 DIAGNOSIS — E559 Vitamin D deficiency, unspecified: Secondary | ICD-10-CM

## 2021-10-06 DIAGNOSIS — Z8639 Personal history of other endocrine, nutritional and metabolic disease: Secondary | ICD-10-CM

## 2021-10-06 DIAGNOSIS — Z833 Family history of diabetes mellitus: Secondary | ICD-10-CM

## 2021-10-06 DIAGNOSIS — N898 Other specified noninflammatory disorders of vagina: Secondary | ICD-10-CM

## 2021-10-06 LAB — WET PREP FOR TRICH, YEAST, CLUE

## 2021-10-06 MED ORDER — DROSPIRENONE-ETHINYL ESTRADIOL 3-0.03 MG PO TABS: 1.0000 | ORAL_TABLET | Freq: Every day | ORAL | 3 refills | Status: DC

## 2021-10-06 NOTE — Patient Instructions (Signed)

## 2021-10-07 LAB — CBC
HCT: 37.8 % (ref 35.0–45.0)
Hemoglobin: 12.2 g/dL (ref 11.7–15.5)
MCH: 27.7 pg (ref 27.0–33.0)
MCHC: 32.3 g/dL (ref 32.0–36.0)
MCV: 85.7 fL (ref 80.0–100.0)
MPV: 12.1 fL (ref 7.5–12.5)
Platelets: 299 10*3/uL (ref 140–400)
RBC: 4.41 10*6/uL (ref 3.80–5.10)
RDW: 13.2 % (ref 11.0–15.0)
WBC: 7.7 10*3/uL (ref 3.8–10.8)

## 2021-10-07 LAB — SURESWAB CT/NG/T. VAGINALIS
C. trachomatis RNA, TMA: NOT DETECTED
N. gonorrhoeae RNA, TMA: NOT DETECTED
Trichomonas vaginalis RNA: NOT DETECTED

## 2021-10-07 LAB — HIV ANTIBODY (ROUTINE TESTING W REFLEX): HIV 1&2 Ab, 4th Generation: NONREACTIVE

## 2021-10-07 LAB — HEMOGLOBIN A1C
Hgb A1c MFr Bld: 5.5 % of total Hgb (ref <5.7)
Mean Plasma Glucose: 111 mg/dL
eAG (mmol/L): 6.2 mmol/L

## 2021-10-07 LAB — RPR: RPR Ser Ql: NONREACTIVE

## 2021-10-07 LAB — TSH: TSH: 1 mIU/L

## 2021-10-07 LAB — VITAMIN D 25 HYDROXY (VIT D DEFICIENCY, FRACTURES): Vit D, 25-Hydroxy: 38 ng/mL (ref 30–100)

## 2022-10-12 ENCOUNTER — Other Ambulatory Visit: Payer: Self-pay

## 2022-10-12 ENCOUNTER — Ambulatory Visit: Payer: No Typology Code available for payment source | Admitting: Obstetrics and Gynecology

## 2022-10-12 DIAGNOSIS — Z3041 Encounter for surveillance of contraceptive pills: Secondary | ICD-10-CM

## 2022-10-12 NOTE — Telephone Encounter (Signed)
AEX 10/06/2021 Rescheduled to 11/13/22 (pt was r/s'd from 10/12/22 due to Dr. Lennon Alstrom off.)  Needs bcp refilled.

## 2022-10-13 MED ORDER — DROSPIRENONE-ETHINYL ESTRADIOL 3-0.03 MG PO TABS: 1.0000 | ORAL_TABLET | Freq: Every day | ORAL | 0 refills | Status: DC

## 2022-10-14 ENCOUNTER — Other Ambulatory Visit: Payer: Self-pay | Admitting: Obstetrics and Gynecology

## 2022-10-14 DIAGNOSIS — Z3041 Encounter for surveillance of contraceptive pills: Secondary | ICD-10-CM
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
## 2022-11-13 ENCOUNTER — Ambulatory Visit (INDEPENDENT_AMBULATORY_CARE_PROVIDER_SITE_OTHER): Payer: No Typology Code available for payment source | Admitting: Obstetrics and Gynecology

## 2022-11-13 ENCOUNTER — Encounter: Payer: Self-pay | Admitting: Obstetrics and Gynecology

## 2022-11-13 VITALS — BP 104/78 | HR 66 | Ht 71.0 in | Wt 253.0 lb

## 2022-11-13 DIAGNOSIS — Z01419 Encounter for gynecological examination (general) (routine) without abnormal findings: Secondary | ICD-10-CM

## 2022-11-13 DIAGNOSIS — Z0000 Encounter for general adult medical examination without abnormal findings: Secondary | ICD-10-CM

## 2022-11-13 DIAGNOSIS — Z3041 Encounter for surveillance of contraceptive pills: Secondary | ICD-10-CM | POA: Diagnosis not present

## 2022-11-13 DIAGNOSIS — Z833 Family history of diabetes mellitus: Secondary | ICD-10-CM

## 2022-11-13 DIAGNOSIS — E559 Vitamin D deficiency, unspecified: Secondary | ICD-10-CM

## 2022-11-13 DIAGNOSIS — Z114 Encounter for screening for human immunodeficiency virus [HIV]: Secondary | ICD-10-CM

## 2022-11-13 DIAGNOSIS — Z6835 Body mass index (BMI) 35.0-35.9, adult: Secondary | ICD-10-CM

## 2022-11-13 MED ORDER — DROSPIRENONE-ETHINYL ESTRADIOL 3-0.03 MG PO TABS
1.0000 | ORAL_TABLET | Freq: Every day | ORAL | 3 refills | Status: DC
Start: 2022-11-13 — End: 2023-11-15

## 2022-11-13 NOTE — Patient Instructions (Signed)

## 2022-11-14 LAB — COMPREHENSIVE METABOLIC PANEL
AG Ratio: 1.8 (calc) (ref 1.0–2.5)
ALT: 11 U/L (ref 6–29)
AST: 10 U/L (ref 10–30)
Albumin: 4.2 g/dL (ref 3.6–5.1)
Alkaline phosphatase (APISO): 57 U/L (ref 31–125)
BUN: 14 mg/dL (ref 7–25)
CO2: 23 mmol/L (ref 20–32)
Calcium: 9.2 mg/dL (ref 8.6–10.2)
Chloride: 106 mmol/L (ref 98–110)
Creat: 0.74 mg/dL (ref 0.50–0.99)
Globulin: 2.4 g/dL (calc) (ref 1.9–3.7)
Glucose, Bld: 89 mg/dL (ref 65–99)
Potassium: 4.2 mmol/L (ref 3.5–5.3)
Sodium: 136 mmol/L (ref 135–146)
Total Bilirubin: 0.3 mg/dL (ref 0.2–1.2)
Total Protein: 6.6 g/dL (ref 6.1–8.1)

## 2022-11-14 LAB — LIPID PANEL
Cholesterol: 172 mg/dL (ref <200)
HDL: 77 mg/dL (ref >=50)
LDL Cholesterol (Calc): 81 mg/dL (calc)
Non-HDL Cholesterol (Calc): 95 mg/dL (calc) (ref <130)
Total CHOL/HDL Ratio: 2.2 (calc) (ref <5.0)
Triglycerides: 60 mg/dL (ref <150)

## 2022-11-14 LAB — HEMOGLOBIN A1C
Hgb A1c MFr Bld: 5.6 % of total Hgb (ref <5.7)
Mean Plasma Glucose: 114 mg/dL
eAG (mmol/L): 6.3 mmol/L

## 2022-11-14 LAB — HIV ANTIBODY (ROUTINE TESTING W REFLEX): HIV 1&2 Ab, 4th Generation: NONREACTIVE

## 2022-11-14 LAB — VITAMIN D 25 HYDROXY (VIT D DEFICIENCY, FRACTURES): Vit D, 25-Hydroxy: 31 ng/mL (ref 30–100)

## 2023-11-15 ENCOUNTER — Encounter: Payer: Self-pay | Admitting: Obstetrics and Gynecology

## 2023-11-15 ENCOUNTER — Ambulatory Visit (INDEPENDENT_AMBULATORY_CARE_PROVIDER_SITE_OTHER): Admitting: Obstetrics and Gynecology

## 2023-11-15 VITALS — BP 118/80 | HR 72 | Ht 70.75 in | Wt 261.0 lb

## 2023-11-15 DIAGNOSIS — Z1331 Encounter for screening for depression: Secondary | ICD-10-CM | POA: Diagnosis not present

## 2023-11-15 DIAGNOSIS — Z01419 Encounter for gynecological examination (general) (routine) without abnormal findings: Secondary | ICD-10-CM | POA: Insufficient documentation

## 2023-11-15 DIAGNOSIS — N951 Menopausal and female climacteric states: Secondary | ICD-10-CM | POA: Diagnosis not present

## 2023-11-15 DIAGNOSIS — Z3041 Encounter for surveillance of contraceptive pills: Secondary | ICD-10-CM | POA: Diagnosis not present

## 2023-11-15 MED ORDER — DROSPIRENONE-ETHINYL ESTRADIOL 3-0.03 MG PO TABS: 1.0000 | ORAL_TABLET | Freq: Every day | ORAL | 3 refills | Status: AC

## 2023-11-15 NOTE — Patient Instructions (Signed)

## 2023-11-15 NOTE — Assessment & Plan Note (Addendum)
 Anticipatory guidance provided. Lifestyle modifications for VMS were reviewed, including eliminating caffeine. In discussing weight gain related to perimenopause, she was counseled on proper nutrition, regular strength training, improve sleep. Reviewed hormonal testing is not required for treatment of symptoms, however will check at patient's request. Discussed possible need for testosterone supplementation. Offered 35mcg estradiol  COC, wants to wait for results. Reviewed sleep hygiene. Patient can consider using melatonin and magnesium to help with sleep.

## 2023-11-15 NOTE — Assessment & Plan Note (Signed)
 Cervical cancer screening performed according to ASCCP guidelines. Encouraged annual mammogram screening- has to check with insurance about coverage Colonoscopy due next year DXA N/A Labs and immunizations with her primary Encouraged safe sexual practices as indicated Encouraged healthy lifestyle practices with diet and exercise For patients under 50-44yo, I recommend 1200mg  calcium daily and 600IU of vitamin D  daily.
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
## 2023-11-16 ENCOUNTER — Ambulatory Visit: Payer: Self-pay | Admitting: Obstetrics and Gynecology

## 2023-11-19 LAB — CBC
HCT: 39 % (ref 35.0–45.0)
Hemoglobin: 12 g/dL (ref 11.7–15.5)
MCH: 26.8 pg — ABNORMAL LOW (ref 27.0–33.0)
MCHC: 30.8 g/dL — ABNORMAL LOW (ref 32.0–36.0)
MCV: 87.2 fL (ref 80.0–100.0)
MPV: 11.9 fL (ref 7.5–12.5)
Platelets: 271 10*3/uL (ref 140–400)
RBC: 4.47 10*6/uL (ref 3.80–5.10)
RDW: 13.8 % (ref 11.0–15.0)
WBC: 6.5 10*3/uL (ref 3.8–10.8)

## 2023-11-19 LAB — COMPLETE METABOLIC PANEL WITHOUT GFR
AG Ratio: 1.4 (calc) (ref 1.0–2.5)
ALT: 8 U/L (ref 6–29)
AST: 7 U/L — ABNORMAL LOW (ref 10–30)
Albumin: 3.9 g/dL (ref 3.6–5.1)
Alkaline phosphatase (APISO): 47 U/L (ref 31–125)
BUN: 12 mg/dL (ref 7–25)
CO2: 23 mmol/L (ref 20–32)
Calcium: 9.2 mg/dL (ref 8.6–10.2)
Chloride: 106 mmol/L (ref 98–110)
Creat: 0.75 mg/dL (ref 0.50–0.99)
Globulin: 2.7 g/dL (ref 1.9–3.7)
Glucose, Bld: 99 mg/dL (ref 65–99)
Potassium: 4.4 mmol/L (ref 3.5–5.3)
Sodium: 137 mmol/L (ref 135–146)
Total Bilirubin: 0.2 mg/dL (ref 0.2–1.2)
Total Protein: 6.6 g/dL (ref 6.1–8.1)

## 2023-11-19 LAB — TESTOS,TOTAL,FREE AND SHBG (FEMALE)
Free Testosterone: 0.9 pg/mL (ref 0.1–6.4)
Sex Hormone Binding: 394 nmol/L — ABNORMAL HIGH (ref 17–124)
Testosterone, Total, LC-MS-MS: 36 ng/dL (ref 2–45)

## 2023-11-19 LAB — THYROID PANEL WITH TSH
Free Thyroxine Index: 2.1 (ref 1.4–3.8)
T3 Uptake: 26 % (ref 22–35)
T4, Total: 7.9 ug/dL (ref 5.1–11.9)
TSH: 2.63 m[IU]/L

## 2023-11-19 LAB — FOLLICLE STIMULATING HORMONE: FSH: 3.9 m[IU]/mL

## 2023-11-19 LAB — ESTRADIOL: Estradiol: 200 pg/mL

## 2024-01-31 ENCOUNTER — Other Ambulatory Visit: Payer: Self-pay

## 2024-01-31 ENCOUNTER — Emergency Department (HOSPITAL_BASED_OUTPATIENT_CLINIC_OR_DEPARTMENT_OTHER)
Admission: EM | Admit: 2024-01-31 | Discharge: 2024-01-31 | Disposition: A | Discharge status: Home / Self Care | Payer: PRIVATE HEALTH INSURANCE | Attending: Emergency Medicine | Admitting: Emergency Medicine

## 2024-01-31 DIAGNOSIS — M79661 Pain in right lower leg: Secondary | ICD-10-CM | POA: Insufficient documentation

## 2024-01-31 MED ORDER — ENOXAPARIN SODIUM 120 MG/0.8ML IJ SOSY
110.0000 mg | PREFILLED_SYRINGE | Freq: Two times a day (BID) | INTRAMUSCULAR | Status: DC
  Administered 2024-01-31: 110 mg via SUBCUTANEOUS
  Filled 2024-01-31: qty 0.8
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
## 2024-01-31 NOTE — Discharge Instructions (Signed)
 Return tomorrow as scheduled for venous duplex of the right lower leg.

## 2024-01-31 NOTE — ED Triage Notes (Signed)
 Pt POV reporting R leg pain past few days, primarily in calf and going up to thigh, concerned for blood clot. Denies CP/SOB, no swelling noted.

## 2024-01-31 NOTE — ED Provider Notes (Signed)
[place] EMERGENCY DEPARTMENT AT [facility] Provider Note   CSN: [id number] Arrival date & time: 01/31/24  1741     Patient presents with: Leg Pain   Sandy Oconnor is a 43 y.o. female.   Patient to ED with right lower leg pain since earlier today that radiates to the thigh. No swelling or redness. No chest pain or shortness of breath. No fever. No direct injury. She reports recent travel to [place]. She also has DVT risk based on oral contraceptives. No history of clot previously.   The history is provided by the patient. No language interpreter was used.  Leg Pain      Prior to Admission medications   Medication Sig Start Date End Date Taking? Authorizing Provider  clobetasol ointment (TEMOVATE) 0.05 % APPLY TO SCALP 3-4 TIMES PER WEEK 06/23/18   [provider]  drospirenone -ethinyl estradiol  (SYEDA ) 3-0.03 MG tablet Take 1 tablet by mouth daily. 11/15/23   Dallie Vera GAILS, MD  PREVIDENT 5000 BOOSTER PLUS 1.1 % PSTE See admin instructions. 09/15/22   [provider]    Allergies: Sulfa antibiotics    Review of Systems  Updated Vital Signs BP (!) 156/94 (BP Location: Right Arm)   Pulse 78   Temp 99 F (37.2 C)   Resp 18   Ht 5' 11 (1.803 m)   Wt 111.1 kg   LMP 01/28/2024 (Exact Date)   SpO2 100%   BMI 34.17 kg/m   Physical Exam Vitals and nursing note reviewed.  Constitutional:      Appearance: Normal appearance.  Cardiovascular:     Rate and Rhythm: Normal rate.  Pulmonary:     Effort: Pulmonary effort is normal.  Musculoskeletal:        General: Normal range of motion.     Comments: Right calf tenderness without significant swelling that extends to posterior knee and thigh. Distal pulses intact.   Neurological:     Mental Status: She is alert and oriented to person, place, and time.     Sensory: No sensory deficit.     (all labs ordered are listed, but only abnormal results are displayed) Labs Reviewed - No data to  display  EKG: None  Radiology: No results found.   Procedures   Medications Ordered in the ED  enoxaparin  (LOVENOX ) injection 110 mg (110 mg Subcutaneous Given 01/31/24 2138)    Clinical Course as of 01/31/24 2147  Mon Jan 31, 2024  2145 Patient to ED with right calf pain extending to thigh for the past 1 day. No h/o clots, however, has significant risk factors for DVT - oral contraceptives, recent travel. She is also being evaluated for amyloidosis which would increase her clot risk. She will return in the morning for doppler study. Lovenox  given prior to discharge.  [SU]    Clinical Course User Index [SU] Odell Balls, PA-C                                 Medical Decision Making       Final diagnoses:  Pain of right lower leg    ED Discharge Orders          Ordered    US  Venous Img Lower Unilateral Right        01/31/24 2110               Odell Balls, PA-C 01/31/24 2147    Jerrol Agent, MD  01/31/24 2350  

## 2024-02-01 ENCOUNTER — Ambulatory Visit (HOSPITAL_BASED_OUTPATIENT_CLINIC_OR_DEPARTMENT_OTHER)
Admission: RE | Admit: 2024-02-01 | Discharge: 2024-02-01 | Disposition: A | Discharge status: Home / Self Care | Payer: PRIVATE HEALTH INSURANCE | Source: Ambulatory Visit | Attending: Emergency Medicine | Admitting: Emergency Medicine

## 2024-02-01 ENCOUNTER — Telehealth (HOSPITAL_BASED_OUTPATIENT_CLINIC_OR_DEPARTMENT_OTHER): Payer: Self-pay | Admitting: Emergency Medicine

## 2024-02-01 DIAGNOSIS — M79661 Pain in right lower leg: Secondary | ICD-10-CM | POA: Insufficient documentation

## 2024-02-01 NOTE — Telephone Encounter (Signed)
 Called the patient to discuss the results of her DVT ultrasound which resulted negative.  All questions answered.

## 2024-02-21 ENCOUNTER — Encounter: Payer: Self-pay | Admitting: Obstetrics and Gynecology

## 2024-02-21 ENCOUNTER — Telehealth (INDEPENDENT_AMBULATORY_CARE_PROVIDER_SITE_OTHER): Admitting: Obstetrics and Gynecology

## 2024-02-21 VITALS — Wt 261.0 lb

## 2024-02-21 DIAGNOSIS — B009 Herpesviral infection, unspecified: Secondary | ICD-10-CM | POA: Diagnosis not present

## 2024-02-21 DIAGNOSIS — N951 Menopausal and female climacteric states: Secondary | ICD-10-CM

## 2024-02-21 LAB — HM MAMMOGRAPHY

## 2024-02-21 MED ORDER — VALACYCLOVIR HCL 500 MG PO TABS: 500.0000 mg | ORAL_TABLET | Freq: Every day | ORAL | 3 refills | Status: AC
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
## 2024-02-21 NOTE — Assessment & Plan Note (Signed)
 Wants to continue COC for now. Discussed use of estroven, multivitamin, vit D supplementation (given prior insufficiency), and decreasing caffeine and EtOH intake.

## 2024-02-23 ENCOUNTER — Ambulatory Visit: Payer: Self-pay | Admitting: Obstetrics and Gynecology

## 2024-02-23 ENCOUNTER — Encounter: Payer: Self-pay | Admitting: Obstetrics and Gynecology

## 2024-11-20 ENCOUNTER — Ambulatory Visit: Admitting: Obstetrics and Gynecology
